# Patient Record
Sex: Female | Born: 1945 | Race: Black or African American | Hispanic: No | Marital: Single | Smoking: Former smoker
Health system: Southern US, Community
[De-identification: ages and names within clinical notes are randomized; demographics above are authoritative.]

## PROBLEM LIST (undated history)

## (undated) DIAGNOSIS — E039 Hypothyroidism, unspecified: Secondary | ICD-10-CM

## (undated) DIAGNOSIS — R519 Headache, unspecified: Secondary | ICD-10-CM

## (undated) DIAGNOSIS — F419 Anxiety disorder, unspecified: Secondary | ICD-10-CM

## (undated) DIAGNOSIS — M1990 Unspecified osteoarthritis, unspecified site: Secondary | ICD-10-CM

## (undated) DIAGNOSIS — G4730 Sleep apnea, unspecified: Secondary | ICD-10-CM

## (undated) DIAGNOSIS — R002 Palpitations: Secondary | ICD-10-CM

## (undated) DIAGNOSIS — E079 Disorder of thyroid, unspecified: Secondary | ICD-10-CM

## (undated) DIAGNOSIS — I10 Essential (primary) hypertension: Secondary | ICD-10-CM

## (undated) DIAGNOSIS — R7303 Prediabetes: Secondary | ICD-10-CM

## (undated) HISTORY — PX: CHOLECYSTECTOMY: SHX55

## (undated) HISTORY — PX: BREAST REDUCTION SURGERY: SHX8

## (undated) HISTORY — PX: ABDOMINAL HYSTERECTOMY: SHX81

## (undated) HISTORY — PX: APPENDECTOMY: SHX54

---

## 2000-02-06 ENCOUNTER — Encounter: Payer: Self-pay | Admitting: Internal Medicine

## 2000-02-06 ENCOUNTER — Ambulatory Visit (HOSPITAL_COMMUNITY): Admission: RE | Admit: 2000-02-06 | Discharge: 2000-02-06 | Payer: Self-pay | Admitting: Internal Medicine

## 2001-09-04 ENCOUNTER — Emergency Department (HOSPITAL_COMMUNITY): Admission: EM | Admit: 2001-09-04 | Discharge: 2001-09-04 | Payer: Self-pay | Admitting: Emergency Medicine

## 2001-09-04 ENCOUNTER — Encounter: Payer: Self-pay | Admitting: Emergency Medicine

## 2001-12-15 ENCOUNTER — Emergency Department (HOSPITAL_COMMUNITY): Admission: EM | Admit: 2001-12-15 | Discharge: 2001-12-15 | Payer: Self-pay | Admitting: Emergency Medicine

## 2001-12-15 ENCOUNTER — Encounter: Payer: Self-pay | Admitting: Emergency Medicine

## 2002-03-07 ENCOUNTER — Emergency Department (HOSPITAL_COMMUNITY): Admission: EM | Admit: 2002-03-07 | Discharge: 2002-03-07 | Payer: Self-pay | Admitting: Emergency Medicine

## 2002-03-07 ENCOUNTER — Encounter: Payer: Self-pay | Admitting: Emergency Medicine

## 2002-09-07 ENCOUNTER — Emergency Department (HOSPITAL_COMMUNITY): Admission: EM | Admit: 2002-09-07 | Discharge: 2002-09-07 | Payer: Self-pay | Admitting: Emergency Medicine

## 2003-05-28 DIAGNOSIS — J189 Pneumonia, unspecified organism: Secondary | ICD-10-CM

## 2003-05-28 HISTORY — DX: Pneumonia, unspecified organism: J18.9

## 2010-04-20 ENCOUNTER — Emergency Department (HOSPITAL_COMMUNITY): Admission: EM | Admit: 2010-04-20 | Discharge: 2010-04-20 | Payer: Self-pay | Admitting: Family Medicine

## 2014-04-10 ENCOUNTER — Emergency Department (HOSPITAL_COMMUNITY): Payer: No Typology Code available for payment source

## 2014-04-10 ENCOUNTER — Encounter (HOSPITAL_COMMUNITY): Payer: Self-pay | Admitting: Emergency Medicine

## 2014-04-10 ENCOUNTER — Emergency Department (HOSPITAL_COMMUNITY)
Admission: EM | Admit: 2014-04-10 | Discharge: 2014-04-10 | Disposition: A | Discharge status: Home / Self Care | Payer: No Typology Code available for payment source | Attending: Emergency Medicine | Admitting: Emergency Medicine

## 2014-04-10 DIAGNOSIS — Z8639 Personal history of other endocrine, nutritional and metabolic disease: Secondary | ICD-10-CM | POA: Diagnosis not present

## 2014-04-10 DIAGNOSIS — S0993XA Unspecified injury of face, initial encounter: Secondary | ICD-10-CM | POA: Diagnosis not present

## 2014-04-10 DIAGNOSIS — S61311A Laceration without foreign body of left index finger with damage to nail, initial encounter: Secondary | ICD-10-CM | POA: Insufficient documentation

## 2014-04-10 DIAGNOSIS — S29001A Unspecified injury of muscle and tendon of front wall of thorax, initial encounter: Secondary | ICD-10-CM | POA: Insufficient documentation

## 2014-04-10 DIAGNOSIS — I10 Essential (primary) hypertension: Secondary | ICD-10-CM | POA: Diagnosis not present

## 2014-04-10 DIAGNOSIS — Y998 Other external cause status: Secondary | ICD-10-CM | POA: Insufficient documentation

## 2014-04-10 DIAGNOSIS — R413 Other amnesia: Secondary | ICD-10-CM | POA: Diagnosis not present

## 2014-04-10 DIAGNOSIS — S6992XA Unspecified injury of left wrist, hand and finger(s), initial encounter: Secondary | ICD-10-CM | POA: Diagnosis present

## 2014-04-10 DIAGNOSIS — Y9389 Activity, other specified: Secondary | ICD-10-CM | POA: Insufficient documentation

## 2014-04-10 DIAGNOSIS — Y92488 Other paved roadways as the place of occurrence of the external cause: Secondary | ICD-10-CM | POA: Diagnosis not present

## 2014-04-10 DIAGNOSIS — S6991XA Unspecified injury of right wrist, hand and finger(s), initial encounter: Secondary | ICD-10-CM | POA: Insufficient documentation

## 2014-04-10 HISTORY — DX: Essential (primary) hypertension: I10

## 2014-04-10 HISTORY — DX: Disorder of thyroid, unspecified: E07.9

## 2014-04-10 MED ORDER — LORAZEPAM 2 MG/ML IJ SOLN
INTRAMUSCULAR | Status: AC
  Filled 2014-04-10: qty 1

## 2014-04-10 MED ORDER — HYDROCODONE-ACETAMINOPHEN 5-325 MG PO TABS
2.0000 | ORAL_TABLET | Freq: Once | ORAL | Status: AC
  Administered 2014-04-10: 2 via ORAL
  Filled 2014-04-10: qty 2

## 2014-04-10 MED ORDER — LIDOCAINE HCL (PF) 1 % IJ SOLN: 30.0000 mL | Freq: Once | INTRAMUSCULAR | Status: DC

## 2014-04-10 MED ORDER — HYDROCODONE-ACETAMINOPHEN 5-325 MG PO TABS: 1.0000 | ORAL_TABLET | Freq: Four times a day (QID) | ORAL | Status: DC | PRN

## 2014-04-10 NOTE — ED Notes (Signed)
Earrings and necklace removed prior to go to CT and xray
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
## 2014-04-10 NOTE — ED Notes (Signed)
Patient transported to X-ray 
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
## 2014-04-22 DIAGNOSIS — E039 Hypothyroidism, unspecified: Secondary | ICD-10-CM | POA: Insufficient documentation

## 2015-04-03 DIAGNOSIS — E669 Obesity, unspecified: Secondary | ICD-10-CM | POA: Diagnosis not present

## 2015-04-03 DIAGNOSIS — R7309 Other abnormal glucose: Secondary | ICD-10-CM | POA: Diagnosis not present

## 2015-04-03 DIAGNOSIS — Z23 Encounter for immunization: Secondary | ICD-10-CM | POA: Diagnosis not present

## 2015-04-03 DIAGNOSIS — R413 Other amnesia: Secondary | ICD-10-CM | POA: Diagnosis not present

## 2015-04-03 DIAGNOSIS — M79642 Pain in left hand: Secondary | ICD-10-CM | POA: Diagnosis not present

## 2015-04-03 DIAGNOSIS — F329 Major depressive disorder, single episode, unspecified: Secondary | ICD-10-CM | POA: Diagnosis not present

## 2015-04-03 DIAGNOSIS — M25512 Pain in left shoulder: Secondary | ICD-10-CM | POA: Diagnosis not present

## 2015-04-03 DIAGNOSIS — E039 Hypothyroidism, unspecified: Secondary | ICD-10-CM | POA: Diagnosis not present

## 2015-04-03 DIAGNOSIS — M179 Osteoarthritis of knee, unspecified: Secondary | ICD-10-CM | POA: Diagnosis not present

## 2015-04-03 DIAGNOSIS — R5383 Other fatigue: Secondary | ICD-10-CM | POA: Diagnosis not present

## 2015-04-15 DIAGNOSIS — M7542 Impingement syndrome of left shoulder: Secondary | ICD-10-CM | POA: Diagnosis not present

## 2015-05-01 DIAGNOSIS — I10 Essential (primary) hypertension: Secondary | ICD-10-CM | POA: Diagnosis not present

## 2015-05-01 DIAGNOSIS — Z23 Encounter for immunization: Secondary | ICD-10-CM | POA: Diagnosis not present

## 2015-05-01 DIAGNOSIS — R7303 Prediabetes: Secondary | ICD-10-CM | POA: Diagnosis not present

## 2015-05-01 DIAGNOSIS — G479 Sleep disorder, unspecified: Secondary | ICD-10-CM | POA: Diagnosis not present

## 2015-05-04 ENCOUNTER — Encounter (HOSPITAL_COMMUNITY): Payer: Self-pay | Admitting: Emergency Medicine

## 2015-05-04 ENCOUNTER — Emergency Department (HOSPITAL_COMMUNITY)
Admission: EM | Admit: 2015-05-04 | Discharge: 2015-05-04 | Disposition: A | Discharge status: Home / Self Care | Payer: Medicare PPO | Source: Home / Self Care | Attending: Family Medicine | Admitting: Family Medicine

## 2015-05-04 DIAGNOSIS — H109 Unspecified conjunctivitis: Secondary | ICD-10-CM

## 2015-05-04 DIAGNOSIS — H5711 Ocular pain, right eye: Secondary | ICD-10-CM | POA: Diagnosis not present

## 2015-05-04 DIAGNOSIS — S058X1A Other injuries of right eye and orbit, initial encounter: Secondary | ICD-10-CM

## 2015-05-04 DIAGNOSIS — S0591XA Unspecified injury of right eye and orbit, initial encounter: Secondary | ICD-10-CM | POA: Diagnosis not present

## 2015-05-04 MED ORDER — TOBRAMYCIN 0.3 % OP SOLN: 1.0000 [drp] | OPHTHALMIC | Status: DC

## 2015-05-04 MED ORDER — TETRACAINE HCL 0.5 % OP SOLN
OPHTHALMIC | Status: AC
  Filled 2015-05-04: qty 2

## 2015-05-04 NOTE — ED Provider Notes (Signed)
CSN: [id number]     Arrival date & time 05/04/15  1432 History   First MD Initiated Contact with Patient 05/04/15 1507     Chief Complaint  Patient presents with  . Eye Problem   (Consider location/radiation/quality/duration/timing/severity/associated sxs/prior Treatment) HPI Comments: 69 year old female complaining of right eye pain upon awakening this morning. She had placed contact lenses in her eyes last p.m. and was reading. Upon awakening this morning she noticed pain, discomfort, redness and soreness to the right eye. She immediately removed her contact lens. She is having mild blurred vision to the right eye. No known trauma.   Past Medical History  Diagnosis Date  . Thyroid disease   . Hypertension    Past Surgical History  Procedure Laterality Date  . Cholecystectomy    . Abdominal hysterectomy    . Appendectomy     No family history on file. Social History  Substance Use Topics  . Smoking status: Never Smoker   . Smokeless tobacco: None  . Alcohol Use: No   OB History    No data available     Review of Systems  Constitutional: Positive for activity change. Negative for fever and fatigue.  HENT: Negative.   Eyes: Positive for pain, discharge and redness.       Scant clear watery discharge  Respiratory: Negative.   Neurological: Negative.   All other systems reviewed and are negative.   Allergies  Review of patient's allergies indicates no known allergies.  Home Medications   Prior to Admission medications   Medication Sig Start Date End Date Taking? Authorizing Provider  HYDROcodone-acetaminophen (NORCO) 5-325 MG per tablet Take 1-2 tablets by mouth every 6 (six) hours as needed for moderate pain or severe pain. 04/10/14   Orlie Dakin, MD  levothyroxine (SYNTHROID, LEVOTHROID) 100 MCG tablet Take 100 mcg by mouth daily. 01/26/14   Historical Provider, MD  propranolol (INDERAL) 40 MG tablet Take 40 mg by mouth 2 (two) times daily.    Historical Provider,  MD  tobramycin (TOBREX) 0.3 % ophthalmic solution Place 1 drop into the right eye every 4 (four) hours. X 5 days 05/04/15   Janne Napoleon, NP   Meds Ordered and Administered this Visit  Medications - No data to display  BP 140/71 mmHg  Pulse 69  Temp(Src) 97.8 F (36.6 C) (Oral)  Resp 16  SpO2 97% No data found.   Physical Exam  Constitutional: She is oriented to person, place, and time. She appears well-developed and well-nourished. No distress.  HENT:  Mouth/Throat: Oropharynx is clear and moist.  Eyes: EOM are normal. Pupils are equal, round, and reactive to light. No scleral icterus.  Mild to moderate erythema to the lower conjunctiva. Sclera injected. There is a thick mucoid, clear discharge. Anterior chamber is clear. Pupillary reaction is normal. No defects noticed over the cornea. There is a small conjunctival defect over the sclera superior to the cornea. No other defects seen. Full intraocular movement. No bleeding.  Neck: Normal range of motion. Neck supple.  Pulmonary/Chest: Effort normal.  Neurological: She is alert and oriented to person, place, and time. She exhibits normal muscle tone.  Skin: Skin is warm and dry.  Psychiatric: She has a normal mood and affect.  Nursing note and vitals reviewed.   ED Course  Procedures (including critical care time)  Labs Review Labs Reviewed - No data to display  Imaging Review No results found.   Visual Acuity Review  Right Eye Distance: 20/40 Left Eye Distance:  20/50 Bilateral Distance: 20/40  Right Eye Near:   Left Eye Near:    Bilateral Near:         MDM   1. Acute right eye pain   2. Conjunctivitis of right eye   3. Abrasion of eye, right, initial encounter   Tetracaine eye drops OD Examination and irrigation. Under magnification no FB's. Eye exam as above.   suspect this right eye condition may be due to a combination of factors including irritation and possible scleral abrasion due to the contact lens  and infection.  Tobrex eye drops Zaditor eye drops 1 drop in right eye twice daily Sustain eye lubricating drops as needed for dry eyes. Do not use the different eye drops within 1 hour of each other.     Janne Napoleon, NP 05/04/15 [phone]

## 2015-05-04 NOTE — Discharge Instructions (Signed)
How to Use Eye Drops and Eye Ointments Zaditor eye drops 1 drop in right eye twice daily Sustain eye lubricating drops as needed for dry eyes. Do not use the different eye drops within 1 hour of each other. HOW TO APPLY EYE DROPS Follow these steps when applying eye drops: 1. Wash your hands. 2. Tilt your head back. 3. Put a finger under your eye and use it to gently pull your lower lid downward. Keep that finger in place. 4. Using your other hand, hold the dropper between your thumb and index finger. 5. Position the dropper just over the edge of the lower lid. Hold it as close to your eye as you can without touching the dropper to your eye. 6. Steady your hand. One way to do this is to lean your index finger against your brow. 7. Look up. 8. Slowly and gently squeeze one drop of medicine into your eye. 9. Close your eye. 10. Place a finger between your lower eyelid and your nose. Press gently for 2 minutes. This increases the amount of time that the medicine is exposed to the eye. It also reduces side effects that can develop if the drop gets into the bloodstream through the nose. HOW TO APPLY EYE OINTMENTS Follow these steps when applying eye ointments: 1. Wash your hands. 2. Put a finger under your eye and use it to gently pull your lower lid downward. Keep that finger in place. 3. Using your other hand, place the tip of the tube between your thumb and index finger with the remaining fingers braced against your cheek or nose. 4. Hold the tube just over the edge of your lower lid without touching the tube to your lid or eyeball. 5. Look up. 6. Line the inner part of your lower lid with ointment. 7. Gently pull up on your upper lid and look down. This will force the ointment to spread over the surface of the eye. 8. Release the upper lid. 9. If you can, close your eyes for 1-2 minutes. Do not rub your eyes. If you applied the ointment correctly, your vision will be blurry for a few  minutes. This is normal. ADDITIONAL INFORMATION  Make sure to use the eye drops or ointment as told by your health care provider.  If you have been told to use both eye drops and an eye ointment, apply the eye drops first, then wait 3-4 minutes before you apply the ointment.  Try not to touch the tip of the dropper or tube to your eye. A dropper or tube that has touched the eye can become contaminated.   This information is not intended to replace advice given to you by your health care provider. Make sure you discuss any questions you have with your health care provider.   Document Released: 08/19/2000 Document Revised: 09/27/2014 Document Reviewed: 05/09/2014 Elsevier Interactive Patient Education [employer].

## 2015-05-04 NOTE — ED Notes (Signed)
Right eye redness, irritation.  Onset this morning.  Patient wears contacts.

## 2015-05-25 ENCOUNTER — Ambulatory Visit: Payer: Medicare HMO | Admitting: Nutrition

## 2015-06-06 ENCOUNTER — Ambulatory Visit: Payer: Medicare HMO | Admitting: Neurology

## 2015-06-08 ENCOUNTER — Encounter: Payer: Medicare Other | Attending: Family Medicine | Admitting: Nutrition

## 2015-06-08 ENCOUNTER — Ambulatory Visit: Payer: Medicare PPO | Admitting: Nutrition

## 2015-06-08 ENCOUNTER — Encounter: Payer: Self-pay | Admitting: Nutrition

## 2015-06-08 VITALS — Ht 67.75 in

## 2015-06-08 DIAGNOSIS — R7309 Other abnormal glucose: Secondary | ICD-10-CM | POA: Insufficient documentation

## 2015-06-08 DIAGNOSIS — Z713 Dietary counseling and surveillance: Secondary | ICD-10-CM | POA: Insufficient documentation

## 2015-06-08 DIAGNOSIS — E669 Obesity, unspecified: Secondary | ICD-10-CM | POA: Insufficient documentation

## 2015-06-08 DIAGNOSIS — R739 Hyperglycemia, unspecified: Secondary | ICD-10-CM

## 2015-06-08 NOTE — Patient Instructions (Addendum)
Goals 1. Follow the Plate MEthod 2. Eat three meals per day 3. Cut out sweets and non nutritive sweetners 4. Drink only water 5. Exercise 15-20 minutes per day. 6. Lose 1-2 lbs per week.
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
## 2015-07-05 ENCOUNTER — Ambulatory Visit: Payer: Medicare PPO | Admitting: Neurology

## 2015-07-13 ENCOUNTER — Ambulatory Visit (INDEPENDENT_AMBULATORY_CARE_PROVIDER_SITE_OTHER): Payer: Medicare Other | Admitting: Neurology

## 2015-07-13 ENCOUNTER — Ambulatory Visit: Payer: Medicare PPO | Admitting: Nutrition

## 2015-07-13 ENCOUNTER — Encounter: Payer: Self-pay | Admitting: Neurology

## 2015-07-13 ENCOUNTER — Other Ambulatory Visit (INDEPENDENT_AMBULATORY_CARE_PROVIDER_SITE_OTHER): Payer: Medicare Other

## 2015-07-13 VITALS — BP 128/84 | HR 66 | Resp 18 | Wt 259.0 lb

## 2015-07-13 DIAGNOSIS — Z87820 Personal history of traumatic brain injury: Secondary | ICD-10-CM

## 2015-07-13 DIAGNOSIS — R200 Anesthesia of skin: Secondary | ICD-10-CM

## 2015-07-13 DIAGNOSIS — R413 Other amnesia: Secondary | ICD-10-CM

## 2015-07-13 LAB — VITAMIN B12: Vitamin B-12: 485 pg/mL (ref 211–911)
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
## 2015-07-13 NOTE — Patient Instructions (Addendum)
1. Bloodwork for B12 2. Schedule MRI brain without contrast 3. Recommend starting to see a therapist to work through stressors/anxiety  4. Continue with physical exercise and brain stimulation exercises for brain health 5. Follow-up in 1 year  6. YOU HAVE BEEN SCHEDULED AT [facility] FOR AN MRI ON 07/24/15.    PLEASE ARRIVE @ 10AM   [address] [place], [place] [address]  [phone]

## 2015-07-14 ENCOUNTER — Telehealth: Payer: Self-pay | Admitting: Family Medicine

## 2015-07-14 NOTE — Telephone Encounter (Signed)
-----   Message from Cameron Sprang, MD sent at 07/14/2015  8:40 AM EST ----- Pls let her know b12 level is normal, thanks

## 2015-07-14 NOTE — Telephone Encounter (Signed)
Patient was notified of result. 

## 2015-10-27 ENCOUNTER — Encounter: Payer: Self-pay | Admitting: Neurology

## 2016-07-15 ENCOUNTER — Ambulatory Visit: Payer: Medicare Other | Admitting: Neurology

## 2016-07-24 ENCOUNTER — Ambulatory Visit: Payer: Medicare Other | Admitting: Neurology

## 2016-07-24 DIAGNOSIS — Z029 Encounter for administrative examinations, unspecified: Secondary | ICD-10-CM

## 2016-07-26 ENCOUNTER — Encounter: Payer: Self-pay | Admitting: Neurology

## 2017-04-24 ENCOUNTER — Other Ambulatory Visit: Payer: Self-pay | Admitting: Family Medicine

## 2017-04-24 DIAGNOSIS — Z1231 Encounter for screening mammogram for malignant neoplasm of breast: Secondary | ICD-10-CM

## 2017-04-24 DIAGNOSIS — E2839 Other primary ovarian failure: Secondary | ICD-10-CM

## 2017-06-04 ENCOUNTER — Ambulatory Visit
Admission: RE | Admit: 2017-06-04 | Discharge: 2017-06-04 | Disposition: A | Discharge status: Home / Self Care | Payer: Medicare Other | Source: Ambulatory Visit | Attending: Family Medicine | Admitting: Family Medicine

## 2017-06-04 DIAGNOSIS — Z1231 Encounter for screening mammogram for malignant neoplasm of breast: Secondary | ICD-10-CM

## 2017-06-04 DIAGNOSIS — E2839 Other primary ovarian failure: Secondary | ICD-10-CM

## 2017-06-11 ENCOUNTER — Encounter: Payer: Medicare Other | Attending: Family Medicine | Admitting: Nutrition

## 2017-06-11 ENCOUNTER — Encounter: Payer: Self-pay | Admitting: Nutrition

## 2017-06-11 DIAGNOSIS — R739 Hyperglycemia, unspecified: Secondary | ICD-10-CM

## 2017-06-11 NOTE — Patient Instructions (Signed)
Goals 1. Try to follow My Plate 2. Choose 1 of food  at each meal 3. Chicken, brussels sprouts, and butternut squash is perfect for dinner. Keep drinking water Walk 30 minutes 3 times per week
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
## 2017-07-02 ENCOUNTER — Ambulatory Visit: Payer: Medicare Other | Admitting: Nutrition

## 2017-07-31 ENCOUNTER — Ambulatory Visit: Payer: Medicare Other | Admitting: Nutrition

## 2017-08-19 ENCOUNTER — Ambulatory Visit: Payer: Medicare Other | Admitting: Nutrition

## 2019-12-21 ENCOUNTER — Other Ambulatory Visit: Payer: Self-pay

## 2019-12-21 ENCOUNTER — Encounter (HOSPITAL_COMMUNITY): Payer: Self-pay | Admitting: Emergency Medicine

## 2019-12-21 ENCOUNTER — Emergency Department (HOSPITAL_COMMUNITY)
Admission: EM | Admit: 2019-12-21 | Discharge: 2019-12-21 | Disposition: A | Discharge status: Home / Self Care | Payer: Medicare Other | Attending: Emergency Medicine | Admitting: Emergency Medicine

## 2019-12-21 DIAGNOSIS — R42 Dizziness and giddiness: Secondary | ICD-10-CM | POA: Diagnosis present

## 2019-12-21 DIAGNOSIS — Z79899 Other long term (current) drug therapy: Secondary | ICD-10-CM | POA: Insufficient documentation

## 2019-12-21 DIAGNOSIS — I10 Essential (primary) hypertension: Secondary | ICD-10-CM | POA: Diagnosis not present

## 2019-12-21 DIAGNOSIS — Z87891 Personal history of nicotine dependence: Secondary | ICD-10-CM | POA: Insufficient documentation

## 2019-12-21 LAB — URINALYSIS, ROUTINE W REFLEX MICROSCOPIC
Bilirubin Urine: NEGATIVE
Glucose, UA: NEGATIVE mg/dL
Hgb urine dipstick: NEGATIVE
Ketones, ur: NEGATIVE mg/dL
Leukocytes,Ua: NEGATIVE
Nitrite: NEGATIVE
Protein, ur: NEGATIVE mg/dL
Specific Gravity, Urine: 1.003 — ABNORMAL LOW (ref 1.005–1.030)
pH: 8 (ref 5.0–8.0)

## 2019-12-21 LAB — BASIC METABOLIC PANEL
Anion gap: 8 (ref 5–15)
BUN: 6 mg/dL — ABNORMAL LOW (ref 8–23)
CO2: 27 mmol/L (ref 22–32)
Calcium: 9.2 mg/dL (ref 8.9–10.3)
Chloride: 104 mmol/L (ref 98–111)
Creatinine, Ser: 0.61 mg/dL (ref 0.44–1.00)
GFR calc Af Amer: >60 mL/min (ref >60)
GFR calc non Af Amer: >60 mL/min (ref >60)
Glucose, Bld: 116 mg/dL — ABNORMAL HIGH (ref 70–99)
Potassium: 4.3 mmol/L (ref 3.5–5.1)
Sodium: 139 mmol/L (ref 135–145)

## 2019-12-21 LAB — CBC WITH DIFFERENTIAL/PLATELET
Abs Immature Granulocytes: 0.02 10*3/uL (ref 0.00–0.07)
Basophils Absolute: 0.1 10*3/uL (ref 0.0–0.1)
Basophils Relative: 1 %
Eosinophils Absolute: 0.2 10*3/uL (ref 0.0–0.5)
Eosinophils Relative: 3 %
HCT: 41 % (ref 36.0–46.0)
Hemoglobin: 13.2 g/dL (ref 12.0–15.0)
Immature Granulocytes: 0 %
Lymphocytes Relative: 40 %
Lymphs Abs: 3.3 10*3/uL (ref 0.7–4.0)
MCH: 33.2 pg (ref 26.0–34.0)
MCHC: 32.2 g/dL (ref 30.0–36.0)
MCV: 103 fL — ABNORMAL HIGH (ref 80.0–100.0)
Monocytes Absolute: 0.4 10*3/uL (ref 0.1–1.0)
Monocytes Relative: 5 %
Neutro Abs: 4.2 10*3/uL (ref 1.7–7.7)
Neutrophils Relative %: 51 %
Platelets: 325 10*3/uL (ref 150–400)
RBC: 3.98 MIL/uL (ref 3.87–5.11)
RDW: 13.3 % (ref 11.5–15.5)
WBC: 8.1 10*3/uL (ref 4.0–10.5)
nRBC: 0 % (ref 0.0–0.2)

## 2019-12-21 NOTE — Discharge Instructions (Addendum)
Stop taking the HCTZ until you follow-up with Dr. Sheryn Bison on Friday.  Continue taking your remaining prescribed medications as directed.

## 2019-12-21 NOTE — ED Provider Notes (Signed)
Shared service with APP.  I have personally seen and examined the patient, providing direct face to face care.  Physical exam findings and plan include patient presents with intermittent dizziness and general weakness since starting hydrochlorothiazide.  Patient well-appearing on exam, neurologically doing well, no fever, normal heart rate.  Discussed holding hydrochlorothiazide and close follow-up with primary doctor for which she already has an appointment.  Screening blood work unremarkable normal hemoglobin, normal electrolytes, patient did have urinary frequency no signs of infection on urinalysis.  No diagnosis found.     Elnora Morrison, MD 12/28/19 [phone]

## 2019-12-21 NOTE — ED Triage Notes (Signed)
Pt states she was prescribed HCTZ. Was already on propanolol. Still taking both. C/o of dizziness and weakness.   Pt is not orthostatic
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
## 2020-05-04 ENCOUNTER — Ambulatory Visit: Payer: Medicare Other | Attending: Internal Medicine

## 2020-05-04 DIAGNOSIS — Z23 Encounter for immunization: Secondary | ICD-10-CM

## 2020-05-04 NOTE — Progress Notes (Signed)
   Covid-19 Vaccination Clinic  Name:  Ruth Marquez    MRN: [id number] DOB: December 31, 1945  05/04/2020  [name] was observed post Covid-19 immunization for 15 minutes without incident. She was provided with Vaccine Information Sheet and instruction to access the V-Safe system.   [name] was instructed to call 911 with any severe reactions post vaccine: Marland Kitchen Difficulty breathing  . Swelling of face and throat  . A fast heartbeat  . A bad rash all over body  . Dizziness and weakness   Immunizations Administered    Name Date Dose VIS Date Route   Pfizer COVID-19 Vaccine 05/04/2020  2:28 PM 0.3 mL 03/15/2020 Intramuscular   Manufacturer: [employer]   Lot: [id number]   NDC: [address]-8

## 2020-08-29 ENCOUNTER — Other Ambulatory Visit: Payer: Self-pay

## 2020-08-29 ENCOUNTER — Encounter (HOSPITAL_COMMUNITY): Payer: Self-pay | Admitting: Emergency Medicine

## 2020-08-29 ENCOUNTER — Emergency Department (HOSPITAL_COMMUNITY)
Admission: EM | Admit: 2020-08-29 | Discharge: 2020-08-29 | Disposition: A | Discharge status: Home / Self Care | Payer: Medicare Other | Attending: Emergency Medicine | Admitting: Emergency Medicine

## 2020-08-29 DIAGNOSIS — Z87891 Personal history of nicotine dependence: Secondary | ICD-10-CM | POA: Insufficient documentation

## 2020-08-29 DIAGNOSIS — M25562 Pain in left knee: Secondary | ICD-10-CM | POA: Insufficient documentation

## 2020-08-29 DIAGNOSIS — I10 Essential (primary) hypertension: Secondary | ICD-10-CM | POA: Insufficient documentation

## 2020-08-29 DIAGNOSIS — R4589 Other symptoms and signs involving emotional state: Secondary | ICD-10-CM | POA: Insufficient documentation

## 2020-08-29 DIAGNOSIS — J309 Allergic rhinitis, unspecified: Secondary | ICD-10-CM | POA: Insufficient documentation

## 2020-08-29 DIAGNOSIS — D126 Benign neoplasm of colon, unspecified: Secondary | ICD-10-CM | POA: Insufficient documentation

## 2020-08-29 DIAGNOSIS — F32A Depression, unspecified: Secondary | ICD-10-CM | POA: Insufficient documentation

## 2020-08-29 DIAGNOSIS — F418 Other specified anxiety disorders: Secondary | ICD-10-CM | POA: Insufficient documentation

## 2020-08-29 DIAGNOSIS — H9190 Unspecified hearing loss, unspecified ear: Secondary | ICD-10-CM | POA: Insufficient documentation

## 2020-08-29 DIAGNOSIS — G479 Sleep disorder, unspecified: Secondary | ICD-10-CM | POA: Insufficient documentation

## 2020-08-29 DIAGNOSIS — E039 Hypothyroidism, unspecified: Secondary | ICD-10-CM | POA: Insufficient documentation

## 2020-08-29 DIAGNOSIS — R7303 Prediabetes: Secondary | ICD-10-CM | POA: Insufficient documentation

## 2020-08-29 DIAGNOSIS — Z79899 Other long term (current) drug therapy: Secondary | ICD-10-CM | POA: Diagnosis not present

## 2020-08-29 DIAGNOSIS — E785 Hyperlipidemia, unspecified: Secondary | ICD-10-CM | POA: Insufficient documentation

## 2020-08-29 DIAGNOSIS — M79642 Pain in left hand: Secondary | ICD-10-CM | POA: Insufficient documentation

## 2020-08-29 DIAGNOSIS — R42 Dizziness and giddiness: Secondary | ICD-10-CM | POA: Diagnosis present

## 2020-08-29 DIAGNOSIS — M179 Osteoarthritis of knee, unspecified: Secondary | ICD-10-CM | POA: Insufficient documentation

## 2020-08-29 DIAGNOSIS — M1710 Unilateral primary osteoarthritis, unspecified knee: Secondary | ICD-10-CM | POA: Insufficient documentation

## 2020-08-29 DIAGNOSIS — E538 Deficiency of other specified B group vitamins: Secondary | ICD-10-CM | POA: Insufficient documentation

## 2020-08-29 DIAGNOSIS — F411 Generalized anxiety disorder: Secondary | ICD-10-CM | POA: Insufficient documentation

## 2020-08-29 DIAGNOSIS — E669 Obesity, unspecified: Secondary | ICD-10-CM | POA: Insufficient documentation

## 2020-08-29 LAB — TROPONIN I (HIGH SENSITIVITY): Troponin I (High Sensitivity): 3 ng/L (ref <18)

## 2020-08-29 LAB — COMPREHENSIVE METABOLIC PANEL
ALT: 12 U/L (ref 0–44)
AST: 17 U/L (ref 15–41)
Albumin: 3.5 g/dL (ref 3.5–5.0)
Alkaline Phosphatase: 80 U/L (ref 38–126)
Anion gap: 11 (ref 5–15)
BUN: 9 mg/dL (ref 8–23)
CO2: 24 mmol/L (ref 22–32)
Calcium: 9.3 mg/dL (ref 8.9–10.3)
Chloride: 104 mmol/L (ref 98–111)
Creatinine, Ser: 0.64 mg/dL (ref 0.44–1.00)
GFR, Estimated: >60 mL/min (ref >60)
Glucose, Bld: 172 mg/dL — ABNORMAL HIGH (ref 70–99)
Potassium: 3.8 mmol/L (ref 3.5–5.1)
Sodium: 139 mmol/L (ref 135–145)
Total Bilirubin: 0.4 mg/dL (ref 0.3–1.2)
Total Protein: 7.5 g/dL (ref 6.5–8.1)

## 2020-08-29 LAB — CBC WITH DIFFERENTIAL/PLATELET
Abs Immature Granulocytes: 0.03 10*3/uL (ref 0.00–0.07)
Basophils Absolute: 0.1 10*3/uL (ref 0.0–0.1)
Basophils Relative: 1 %
Eosinophils Absolute: 0.2 10*3/uL (ref 0.0–0.5)
Eosinophils Relative: 2 %
HCT: 40.7 % (ref 36.0–46.0)
Hemoglobin: 13.2 g/dL (ref 12.0–15.0)
Immature Granulocytes: 0 %
Lymphocytes Relative: 40 %
Lymphs Abs: 4.1 10*3/uL — ABNORMAL HIGH (ref 0.7–4.0)
MCH: 33.1 pg (ref 26.0–34.0)
MCHC: 32.4 g/dL (ref 30.0–36.0)
MCV: 102 fL — ABNORMAL HIGH (ref 80.0–100.0)
Monocytes Absolute: 0.7 10*3/uL (ref 0.1–1.0)
Monocytes Relative: 7 %
Neutro Abs: 5.3 10*3/uL (ref 1.7–7.7)
Neutrophils Relative %: 50 %
Platelets: 334 10*3/uL (ref 150–400)
RBC: 3.99 MIL/uL (ref 3.87–5.11)
RDW: 12.7 % (ref 11.5–15.5)
WBC: 10.4 10*3/uL (ref 4.0–10.5)
nRBC: 0 % (ref 0.0–0.2)

## 2020-08-29 LAB — TSH: TSH: 0.139 u[IU]/mL — ABNORMAL LOW (ref 0.350–4.500)

## 2020-08-29 MED ORDER — SODIUM CHLORIDE 0.9 % IV BOLUS
1000.0000 mL | Freq: Once | INTRAVENOUS | Status: AC
  Administered 2020-08-29: 1000 mL via INTRAVENOUS

## 2020-08-29 NOTE — ED Triage Notes (Signed)
Pt c/o dizziness and shaky feeling after waking up at 0400.

## 2020-08-29 NOTE — Discharge Instructions (Addendum)
Follow-up with your primary doctor in the next few days if symptoms are not improving.

## 2020-08-29 NOTE — ED Notes (Signed)
Patient seen in the ED tonight for dizziness, discharged home, VSS, ambulatory with steady gait. Patient verbalized understanding of discharge instructions and return parameters.

## 2020-08-29 NOTE — ED Provider Notes (Signed)
[facility] EMERGENCY DEPARTMENT Provider Note   CSN: [id number] Arrival date & time: 08/29/20  0440     History Chief Complaint  Patient presents with  . Dizziness    Ruth Marquez is a 75 y.o. female.  Patient is a 75 year old female with history of hypertension and hypothyroidism.  Patient presents today for evaluation of dizziness.  Patient tells me she woke from sleep at approximately 4 AM feeling jittery and shaky.  She felt dizzy when she stood to ambulate, then drove herself here.  She describes this as a lightheaded sensation, not as a spinning or disequilibrium.  Patient tells me she has had difficulty sleeping recently.  She is concerned that there is something wrong, possibly her blood sugar.  She denies any fevers or chills.  She denies any chest pain or difficulty breathing.  The history is provided by the patient.  Dizziness Quality:  Lightheadedness Severity:  Moderate Onset quality:  Sudden Duration:  1 hour Timing:  Constant Progression:  Unchanged Chronicity:  New Relieved by:  Nothing Worsened by:  Nothing Ineffective treatments:  None tried      Past Medical History:  Diagnosis Date  . Hypertension   . Thyroid disease     Patient Active Problem List   Diagnosis Date Noted  . Allergic rhinitis 08/29/2020  . Anxiety about health 08/29/2020  . Obesity 08/29/2020  . Depression 08/29/2020  . Generalized anxiety disorder 08/29/2020  . Hearing loss 08/29/2020  . Hyperlipidemia, unspecified 08/29/2020  . Hypertension 08/29/2020  . Osteoarthritis of knee 08/29/2020  . Pain in left knee 08/29/2020  . Pain of left hand 08/29/2020  . Prediabetes 08/29/2020  . Sleep disturbance 08/29/2020  . Tubular adenoma of colon 08/29/2020  . Vitamin B12 deficiency (non anemic) 08/29/2020  . Memory change 07/13/2015  . History of concussion 07/13/2015  . Left sided numbness 07/13/2015  . Acquired hypothyroidism 04/22/2014    Past Surgical History:  Procedure  Laterality Date  . ABDOMINAL HYSTERECTOMY    . APPENDECTOMY    . CHOLECYSTECTOMY       OB History   No obstetric history on file.     Family History  Problem Relation Age of Onset  . Heart disease Father     Social History   Tobacco Use  . Smoking status: Former Smoker    Types: Cigarettes  . Smokeless tobacco: Never Used  Substance Use Topics  . Alcohol use: No    Alcohol/week: 0.0 standard drinks    Home Medications Prior to Admission medications   Medication Sig Start Date End Date Taking? Authorizing Provider  levothyroxine (SYNTHROID, LEVOTHROID) 100 MCG tablet Take 100 mcg by mouth daily. 01/26/14   [provider]  propranolol (INDERAL) 40 MG tablet Take 40 mg by mouth 2 (two) times daily.    [provider]  tobramycin (TOBREX) 0.3 % ophthalmic solution Place 1 drop into the right eye every 4 (four) hours. X 5 days 05/04/15   Janne Napoleon, NP    Allergies    Gluten meal  Review of Systems   Review of Systems  Neurological: Positive for dizziness.  All other systems reviewed and are negative.   Physical Exam Updated Vital Signs BP (!) 154/90   Pulse 88   Temp 97.6 F (36.4 C)   Resp 17   Ht 5\' 7"  (1.702 m)   Wt 113.4 kg   SpO2 100%   BMI 39.16 kg/m   Physical Exam Vitals and nursing note reviewed.  Constitutional:      General: She is not in acute distress.    Appearance: She is well-developed. She is not diaphoretic.  HENT:     Head: Normocephalic and atraumatic.  Eyes:     Extraocular Movements: Extraocular movements intact.     Pupils: Pupils are equal, round, and reactive to light.  Cardiovascular:     Rate and Rhythm: Normal rate and regular rhythm.     Heart sounds: No murmur heard. No friction rub. No gallop.   Pulmonary:     Effort: Pulmonary effort is normal. No respiratory distress.     Breath sounds: Normal breath sounds. No wheezing.  Abdominal:     General: Bowel sounds are normal. There is no distension.      Palpations: Abdomen is soft.     Tenderness: There is no abdominal tenderness.  Musculoskeletal:        General: Normal range of motion.     Cervical back: Normal range of motion and neck supple.  Skin:    General: Skin is warm and dry.  Neurological:     General: No focal deficit present.     Mental Status: She is alert and oriented to person, place, and time.     Cranial Nerves: No cranial nerve deficit.     Sensory: No sensory deficit.     Motor: No weakness.     Coordination: Coordination normal.     ED Results / Procedures / Treatments   Labs (all labs ordered are listed, but only abnormal results are displayed) Labs Reviewed  COMPREHENSIVE METABOLIC PANEL  CBC WITH DIFFERENTIAL/PLATELET  TSH  TROPONIN I (HIGH SENSITIVITY)    EKG EKG Interpretation  Date/Time:  Tuesday August 29 2020 05:01:36 EDT Ventricular Rate:  89 PR Interval:  162 QRS Duration: 88 QT Interval:  370 QTC Calculation: 451 R Axis:   65 Text Interpretation: Sinus rhythm Low voltage, precordial leads Baseline wander in lead(s) I II aVR aVL Confirmed by Veryl Speak [phone]) on 08/29/2020 5:18:28 AM   Radiology No results found.  Procedures Procedures   Medications Ordered in ED Medications  sodium chloride 0.9 % bolus 1,000 mL (has no administration in time range)    ED Course  I have reviewed the triage vital signs and the nursing notes.  Pertinent labs & imaging results that were available during my care of the patient were reviewed by me and considered in my medical decision making (see chart for details).    MDM Rules/Calculators/A&P  Patient presenting here with complaints of feeling jittery and lightheaded.  This woke her from sleep at 4 AM.  Patient's vitals are stable and physical examination is nonfocal.  Her EKG is normal and laboratory studies are essentially unremarkable with the exception of a blood sugar of 180.  Patient advised to follow-up with primary doctor.  I see no  indication for admission.  She tells me she is feeling significantly improved after receiving IV fluids and reassurance.  Final Clinical Impression(s) / ED Diagnoses Final diagnoses:  None    Rx / DC Orders ED Discharge Orders    None       Veryl Speak, MD 08/29/20 [phone]

## 2021-01-24 ENCOUNTER — Other Ambulatory Visit: Payer: Self-pay | Admitting: Family Medicine

## 2021-01-24 ENCOUNTER — Other Ambulatory Visit: Payer: Self-pay | Admitting: *Deleted

## 2021-01-24 DIAGNOSIS — Z1231 Encounter for screening mammogram for malignant neoplasm of breast: Secondary | ICD-10-CM

## 2021-02-09 ENCOUNTER — Ambulatory Visit
Admission: RE | Admit: 2021-02-09 | Discharge: 2021-02-09 | Disposition: A | Discharge status: Home / Self Care | Payer: Medicare Other | Source: Ambulatory Visit | Attending: Family Medicine | Admitting: Family Medicine

## 2021-02-09 ENCOUNTER — Other Ambulatory Visit: Payer: Self-pay

## 2021-02-09 DIAGNOSIS — Z1231 Encounter for screening mammogram for malignant neoplasm of breast: Secondary | ICD-10-CM

## 2021-05-27 DIAGNOSIS — J1282 Pneumonia due to coronavirus disease 2019: Secondary | ICD-10-CM

## 2021-05-27 HISTORY — DX: Pneumonia due to coronavirus disease 2019: J12.82

## 2021-05-29 DIAGNOSIS — E538 Deficiency of other specified B group vitamins: Secondary | ICD-10-CM | POA: Diagnosis not present

## 2021-07-31 DIAGNOSIS — I10 Essential (primary) hypertension: Secondary | ICD-10-CM | POA: Diagnosis not present

## 2021-07-31 DIAGNOSIS — E538 Deficiency of other specified B group vitamins: Secondary | ICD-10-CM | POA: Diagnosis not present

## 2021-07-31 DIAGNOSIS — R7303 Prediabetes: Secondary | ICD-10-CM | POA: Diagnosis not present

## 2021-07-31 DIAGNOSIS — E785 Hyperlipidemia, unspecified: Secondary | ICD-10-CM | POA: Diagnosis not present

## 2021-07-31 DIAGNOSIS — L408 Other psoriasis: Secondary | ICD-10-CM | POA: Diagnosis not present

## 2021-07-31 DIAGNOSIS — E039 Hypothyroidism, unspecified: Secondary | ICD-10-CM | POA: Diagnosis not present

## 2021-08-21 DIAGNOSIS — Z0000 Encounter for general adult medical examination without abnormal findings: Secondary | ICD-10-CM | POA: Diagnosis not present

## 2021-08-21 DIAGNOSIS — E785 Hyperlipidemia, unspecified: Secondary | ICD-10-CM | POA: Diagnosis not present

## 2021-08-21 DIAGNOSIS — J309 Allergic rhinitis, unspecified: Secondary | ICD-10-CM | POA: Diagnosis not present

## 2021-08-21 DIAGNOSIS — I10 Essential (primary) hypertension: Secondary | ICD-10-CM | POA: Diagnosis not present

## 2021-08-21 DIAGNOSIS — F329 Major depressive disorder, single episode, unspecified: Secondary | ICD-10-CM | POA: Diagnosis not present

## 2021-08-21 DIAGNOSIS — E039 Hypothyroidism, unspecified: Secondary | ICD-10-CM | POA: Diagnosis not present

## 2021-10-26 DIAGNOSIS — M62830 Muscle spasm of back: Secondary | ICD-10-CM | POA: Diagnosis not present

## 2022-04-17 DIAGNOSIS — R197 Diarrhea, unspecified: Secondary | ICD-10-CM | POA: Diagnosis not present

## 2022-04-17 DIAGNOSIS — R0981 Nasal congestion: Secondary | ICD-10-CM | POA: Diagnosis not present

## 2022-04-17 DIAGNOSIS — Z03818 Encounter for observation for suspected exposure to other biological agents ruled out: Secondary | ICD-10-CM | POA: Diagnosis not present

## 2022-04-26 DIAGNOSIS — R509 Fever, unspecified: Secondary | ICD-10-CM | POA: Diagnosis not present

## 2022-04-26 DIAGNOSIS — I517 Cardiomegaly: Secondary | ICD-10-CM | POA: Diagnosis not present

## 2022-04-26 DIAGNOSIS — Z79899 Other long term (current) drug therapy: Secondary | ICD-10-CM | POA: Diagnosis not present

## 2022-04-26 DIAGNOSIS — R059 Cough, unspecified: Secondary | ICD-10-CM | POA: Diagnosis not present

## 2022-04-26 DIAGNOSIS — U071 COVID-19: Secondary | ICD-10-CM | POA: Diagnosis not present

## 2022-04-26 DIAGNOSIS — Z91018 Allergy to other foods: Secondary | ICD-10-CM | POA: Diagnosis not present

## 2022-05-16 DIAGNOSIS — R053 Chronic cough: Secondary | ICD-10-CM | POA: Diagnosis not present

## 2022-05-16 DIAGNOSIS — J069 Acute upper respiratory infection, unspecified: Secondary | ICD-10-CM | POA: Diagnosis not present

## 2022-05-27 DIAGNOSIS — C801 Malignant (primary) neoplasm, unspecified: Secondary | ICD-10-CM

## 2022-05-27 HISTORY — DX: Malignant (primary) neoplasm, unspecified: C80.1

## 2022-05-28 ENCOUNTER — Other Ambulatory Visit (HOSPITAL_COMMUNITY): Payer: Self-pay | Admitting: Family Medicine

## 2022-05-28 ENCOUNTER — Ambulatory Visit (HOSPITAL_COMMUNITY)
Admission: RE | Admit: 2022-05-28 | Discharge: 2022-05-28 | Disposition: A | Discharge status: Home / Self Care | Payer: Medicare PPO | Source: Ambulatory Visit | Attending: Family Medicine | Admitting: Family Medicine

## 2022-05-28 DIAGNOSIS — R059 Cough, unspecified: Secondary | ICD-10-CM | POA: Diagnosis not present

## 2022-05-28 DIAGNOSIS — R053 Chronic cough: Secondary | ICD-10-CM

## 2022-05-28 DIAGNOSIS — U071 COVID-19: Secondary | ICD-10-CM | POA: Diagnosis not present

## 2022-05-28 DIAGNOSIS — R062 Wheezing: Secondary | ICD-10-CM | POA: Diagnosis not present

## 2022-06-12 DIAGNOSIS — E039 Hypothyroidism, unspecified: Secondary | ICD-10-CM | POA: Diagnosis not present

## 2022-06-12 DIAGNOSIS — R5383 Other fatigue: Secondary | ICD-10-CM | POA: Diagnosis not present

## 2022-06-12 DIAGNOSIS — E668 Other obesity: Secondary | ICD-10-CM | POA: Diagnosis not present

## 2022-06-12 DIAGNOSIS — E7800 Pure hypercholesterolemia, unspecified: Secondary | ICD-10-CM | POA: Diagnosis not present

## 2022-06-12 DIAGNOSIS — R059 Cough, unspecified: Secondary | ICD-10-CM | POA: Diagnosis not present

## 2022-06-12 DIAGNOSIS — I10 Essential (primary) hypertension: Secondary | ICD-10-CM | POA: Diagnosis not present

## 2022-06-12 DIAGNOSIS — F341 Dysthymic disorder: Secondary | ICD-10-CM | POA: Diagnosis not present

## 2022-07-10 DIAGNOSIS — Z0000 Encounter for general adult medical examination without abnormal findings: Secondary | ICD-10-CM | POA: Diagnosis not present

## 2022-07-10 DIAGNOSIS — F341 Dysthymic disorder: Secondary | ICD-10-CM | POA: Diagnosis not present

## 2022-07-10 DIAGNOSIS — E039 Hypothyroidism, unspecified: Secondary | ICD-10-CM | POA: Diagnosis not present

## 2022-07-10 DIAGNOSIS — I10 Essential (primary) hypertension: Secondary | ICD-10-CM | POA: Diagnosis not present

## 2022-07-12 DIAGNOSIS — F341 Dysthymic disorder: Secondary | ICD-10-CM | POA: Diagnosis not present

## 2022-07-12 DIAGNOSIS — E039 Hypothyroidism, unspecified: Secondary | ICD-10-CM | POA: Diagnosis not present

## 2022-10-08 ENCOUNTER — Other Ambulatory Visit: Payer: Self-pay | Admitting: Family Medicine

## 2022-10-08 DIAGNOSIS — R5381 Other malaise: Secondary | ICD-10-CM

## 2022-10-08 DIAGNOSIS — Z1231 Encounter for screening mammogram for malignant neoplasm of breast: Secondary | ICD-10-CM

## 2022-11-27 ENCOUNTER — Other Ambulatory Visit: Payer: Self-pay | Admitting: Family Medicine

## 2022-11-27 DIAGNOSIS — M85851 Other specified disorders of bone density and structure, right thigh: Secondary | ICD-10-CM

## 2022-12-17 ENCOUNTER — Ambulatory Visit
Admission: RE | Admit: 2022-12-17 | Discharge: 2022-12-17 | Disposition: A | Discharge status: Home / Self Care | Payer: Medicare HMO | Source: Ambulatory Visit | Attending: Family Medicine | Admitting: Family Medicine

## 2022-12-17 DIAGNOSIS — Z1231 Encounter for screening mammogram for malignant neoplasm of breast: Secondary | ICD-10-CM

## 2022-12-20 ENCOUNTER — Other Ambulatory Visit: Payer: Self-pay | Admitting: Family Medicine

## 2022-12-20 DIAGNOSIS — R928 Other abnormal and inconclusive findings on diagnostic imaging of breast: Secondary | ICD-10-CM

## 2022-12-27 ENCOUNTER — Encounter: Payer: Self-pay | Admitting: Family Medicine

## 2022-12-27 ENCOUNTER — Ambulatory Visit: Admission: RE | Admit: 2022-12-27 | Payer: Medicare HMO | Source: Ambulatory Visit

## 2022-12-27 ENCOUNTER — Other Ambulatory Visit: Payer: Self-pay | Admitting: Family Medicine

## 2022-12-27 ENCOUNTER — Ambulatory Visit
Admission: RE | Admit: 2022-12-27 | Discharge: 2022-12-27 | Disposition: A | Discharge status: Home / Self Care | Payer: Medicare HMO | Source: Ambulatory Visit | Attending: Family Medicine | Admitting: Family Medicine

## 2022-12-27 DIAGNOSIS — R928 Other abnormal and inconclusive findings on diagnostic imaging of breast: Secondary | ICD-10-CM

## 2022-12-27 DIAGNOSIS — N6320 Unspecified lump in the left breast, unspecified quadrant: Secondary | ICD-10-CM

## 2022-12-27 DIAGNOSIS — R921 Mammographic calcification found on diagnostic imaging of breast: Secondary | ICD-10-CM

## 2023-01-07 ENCOUNTER — Ambulatory Visit
Admission: RE | Admit: 2023-01-07 | Discharge: 2023-01-07 | Disposition: A | Discharge status: Home / Self Care | Payer: Medicare HMO | Source: Ambulatory Visit | Attending: Family Medicine | Admitting: Family Medicine

## 2023-01-07 ENCOUNTER — Ambulatory Visit: Admission: RE | Admit: 2023-01-07 | Payer: Medicare HMO | Source: Ambulatory Visit

## 2023-01-07 ENCOUNTER — Other Ambulatory Visit: Payer: Medicare HMO

## 2023-01-07 DIAGNOSIS — R921 Mammographic calcification found on diagnostic imaging of breast: Secondary | ICD-10-CM

## 2023-01-07 DIAGNOSIS — N6320 Unspecified lump in the left breast, unspecified quadrant: Secondary | ICD-10-CM

## 2023-01-07 HISTORY — PX: BREAST BIOPSY: SHX20

## 2023-01-09 ENCOUNTER — Other Ambulatory Visit: Payer: Self-pay | Admitting: *Deleted

## 2023-01-10 ENCOUNTER — Telehealth: Payer: Self-pay | Admitting: *Deleted

## 2023-01-10 ENCOUNTER — Other Ambulatory Visit: Payer: Self-pay | Admitting: *Deleted

## 2023-01-10 ENCOUNTER — Encounter: Payer: Self-pay | Admitting: *Deleted

## 2023-01-10 DIAGNOSIS — C50412 Malignant neoplasm of upper-outer quadrant of left female breast: Secondary | ICD-10-CM | POA: Insufficient documentation

## 2023-01-10 NOTE — Telephone Encounter (Signed)
Left message for a return phone call to discuss navigation resources and answer any questions.  Contact information left on VM.

## 2023-01-13 ENCOUNTER — Inpatient Hospital Stay: Payer: Medicare HMO

## 2023-01-13 ENCOUNTER — Other Ambulatory Visit: Payer: Self-pay

## 2023-01-13 ENCOUNTER — Inpatient Hospital Stay: Payer: Medicare HMO | Attending: Hematology and Oncology | Admitting: Hematology and Oncology

## 2023-01-13 ENCOUNTER — Other Ambulatory Visit: Payer: Self-pay | Admitting: Surgery

## 2023-01-13 VITALS — BP 138/57 | HR 73 | Temp 97.3°F | Resp 18 | Ht 67.0 in | Wt 248.2 lb

## 2023-01-13 DIAGNOSIS — Z853 Personal history of malignant neoplasm of breast: Secondary | ICD-10-CM

## 2023-01-13 DIAGNOSIS — Z90710 Acquired absence of both cervix and uterus: Secondary | ICD-10-CM | POA: Diagnosis not present

## 2023-01-13 DIAGNOSIS — Z87891 Personal history of nicotine dependence: Secondary | ICD-10-CM | POA: Insufficient documentation

## 2023-01-13 DIAGNOSIS — C50412 Malignant neoplasm of upper-outer quadrant of left female breast: Secondary | ICD-10-CM

## 2023-01-13 DIAGNOSIS — Z170 Estrogen receptor positive status [ER+]: Secondary | ICD-10-CM | POA: Insufficient documentation

## 2023-01-13 LAB — CBC WITH DIFFERENTIAL (CANCER CENTER ONLY)
Abs Immature Granulocytes: 0.02 10*3/uL (ref 0.00–0.07)
Basophils Absolute: 0.1 10*3/uL (ref 0.0–0.1)
Basophils Relative: 1 %
Eosinophils Absolute: 0.3 10*3/uL (ref 0.0–0.5)
Eosinophils Relative: 3 %
HCT: 39.5 % (ref 36.0–46.0)
Hemoglobin: 12.9 g/dL (ref 12.0–15.0)
Immature Granulocytes: 0 %
Lymphocytes Relative: 38 %
Lymphs Abs: 3.4 10*3/uL (ref 0.7–4.0)
MCH: 31.9 pg (ref 26.0–34.0)
MCHC: 32.7 g/dL (ref 30.0–36.0)
MCV: 97.5 fL (ref 80.0–100.0)
Monocytes Absolute: 0.6 10*3/uL (ref 0.1–1.0)
Monocytes Relative: 6 %
Neutro Abs: 4.7 10*3/uL (ref 1.7–7.7)
Neutrophils Relative %: 52 %
Platelet Count: 297 10*3/uL (ref 150–400)
RBC: 4.05 MIL/uL (ref 3.87–5.11)
RDW: 13.4 % (ref 11.5–15.5)
WBC Count: 9 10*3/uL (ref 4.0–10.5)
nRBC: 0 % (ref 0.0–0.2)

## 2023-01-13 LAB — CMP (CANCER CENTER ONLY)
ALT: 18 U/L (ref 0–44)
AST: 20 U/L (ref 15–41)
Albumin: 3.9 g/dL (ref 3.5–5.0)
Alkaline Phosphatase: 85 U/L (ref 38–126)
Anion gap: 6 (ref 5–15)
BUN: 17 mg/dL (ref 8–23)
CO2: 27 mmol/L (ref 22–32)
Calcium: 9.6 mg/dL (ref 8.9–10.3)
Chloride: 106 mmol/L (ref 98–111)
Creatinine: 0.77 mg/dL (ref 0.44–1.00)
GFR, Estimated: >60 mL/min (ref >60)
Glucose, Bld: 129 mg/dL — ABNORMAL HIGH (ref 70–99)
Potassium: 4 mmol/L (ref 3.5–5.1)
Sodium: 139 mmol/L (ref 135–145)
Total Bilirubin: 0.5 mg/dL (ref 0.3–1.2)
Total Protein: 7.8 g/dL (ref 6.5–8.1)

## 2023-01-13 NOTE — Assessment & Plan Note (Signed)
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 After reviewing the pathology in detail, we proceeded to discuss the different treatment options between surgery, radiation, chemotherapy, antiestrogen therapies.  Recommendations: 1. Breast conserving surgery followed by 2. Oncotype DX testing based on the final surgery result 3. Adjuvant radiation therapy followed by 4. Adjuvant antiestrogen therapy  Oncotype counseling: I discussed Oncotype DX test. I explained to the patient that this is a 21 gene panel to evaluate patient tumors DNA to calculate recurrence score. This would help determine whether patient has high risk or low risk breast cancer. She understands that if her tumor was found to be high risk, she would benefit from systemic chemotherapy. If low risk, no need of chemotherapy.  Return to clinic after surgery to discuss final pathology report and then determine if Oncotype DX testing will need to be sent.

## 2023-01-13 NOTE — Progress Notes (Signed)
[facility] Clinical Social Work  Initial Assessment   Ruth Marquez is a 77 y.o. year old female presenting alone. Clinical Social Work was referred by medical provider for assessment of psychosocial needs.   SDOH (Social Determinants of Health) assessments performed: Yes   SDOH Screenings   Food Insecurity: Food Insecurity Present (09/27/2022)   Received from [facility], [facility]  Transportation Needs: No Transportation Needs (09/27/2022)   Received from [facility], [facility]  Utilities: At Risk (09/27/2022)   Received from [facility], [facility]  Financial Resource Strain: High Risk (09/27/2022)   Received from [facility], [facility]  Social Connections: Unknown (04/26/2022)   Received from [facility], [facility]  Tobacco Use: Medium Risk (12/26/2022)   Received from [facility]     Distress Screen completed: No     No data to display            Family/Social Information:   Housing Arrangement: patient lives alone at her home. Family members/support persons in your life? Family Transportation concerns: no  Employment: Retired.  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 no, patient does not fully understand treatment plan.  Concerns about diagnosis and/or treatment: Feelings of anger or sadness, Overwhelmed by information, and Afraid of cancer Patient reported stressors: Adjusting to my illness and Relating to God Hopes and/or priorities: to complete treatment.  Patient enjoys being outside Current coping skills/ strengths: Ability for insight , Average or above average intelligence , Capable of independent living , Communication  skills , General fund of knowledge , Motivation for treatment/growth , Religious Affiliation , and Special hobby/interest     SUMMARY: Current SDOH Barriers:  Limited social support  Clinical Social Work Clinical Goal(s):  Demonstrate a reduction in symptoms related to :adjusting to treatment.   Interventions: Discussed common feeling and emotions when being diagnosed with cancer, and the importance of support during treatment Informed patient of the support team roles and support services at [facility] Provided CSW contact information and encouraged patient to call with any questions or concerns Discussed the impact of treatment on identity and the meaning of treatment for patient.    Follow Up Plan: CSW will follow-up with patient by phone  Patient verbalizes understanding of plan: Yes  Marguerita Merles, LCSWA Clinical Social Worker [facility]

## 2023-01-14 ENCOUNTER — Other Ambulatory Visit: Payer: Self-pay | Admitting: Surgery

## 2023-01-14 ENCOUNTER — Telehealth: Payer: Self-pay

## 2023-01-14 ENCOUNTER — Encounter: Payer: Self-pay | Admitting: *Deleted

## 2023-01-14 DIAGNOSIS — Z853 Personal history of malignant neoplasm of breast: Secondary | ICD-10-CM
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
## 2023-01-14 NOTE — Telephone Encounter (Signed)
[facility] CSW Progress Note  Clinical Social Worker  attempted  to contact patient as requested to follow up after surgical consult. No answer, left vm with direct contact information.   Marguerita Merles, LCSWA Clinical Social Worker [facility]

## 2023-01-16 NOTE — Pre-Procedure Instructions (Addendum)
Surgical Instructions   Your procedure is scheduled on January 23, 2023. Report to [facility] Main Entrance "A" at 8:00 A.M., then check in with the Admitting office. Any questions or running late day of surgery: call [phone]  Questions prior to your surgery date: call [phone], Monday-Friday, 8am-4pm. If you experience any cold or flu symptoms such as cough, fever, chills, shortness of breath, etc. between now and your scheduled surgery, please notify us at the above number.     Remember:  Do not eat after midnight the night before your surgery   You may drink clear liquids until 7:00 AM the morning of your surgery.   Clear liquids allowed are: Water, Non-Citrus Juices (without pulp), Carbonated Beverages, Clear Tea, Black Coffee Only (NO MILK, CREAM OR POWDERED CREAMER of any kind), and Gatorade.  Patient Instructions  The night before surgery:  No food after midnight. ONLY clear liquids after midnight  The day of surgery (if you do NOT have diabetes):  Drink ONE (1) Pre-Surgery Clear Ensure by 7:00 AM the morning of surgery. Drink in one sitting. Do not sip.  This drink was given to you during your hospital  pre-op appointment visit.  Nothing else to drink after completing the  Pre-Surgery Clear Ensure.         If you have questions, please contact your surgeon's office.     Take these medicines the morning of surgery with A SIP OF WATER: amLODipine (NORVASC)  atorvastatin (LIPITOR)  isosorbide mononitrate (IMDUR)  levothyroxine (SYNTHROID, LEVOTHROID)  nebivolol (BYSTOLIC)  propranolol (INDERAL)    May take these medicines IF NEEDED: acetaminophen (TYLENOL)  ALPRAZolam (XANAX)  sodium chloride (OCEAN) nasal spray    Follow your surgeon's instructions on when to stop Aspirin.  If no instructions were given by your surgeon then you will need to call the office to get those instructions.     One week prior to surgery, STOP taking any Aleve, Naproxen,  Ibuprofen, Motrin, Advil, Goody's, BC's, all herbal medications, fish oil, and non-prescription vitamins.                     Do NOT Smoke (Tobacco/Vaping) for 24 hours prior to your procedure.  If you use a CPAP at night, you may bring your mask/headgear for your overnight stay.   You will be asked to remove any contacts, glasses, piercing's, hearing aid's, dentures/partials prior to surgery. Please bring cases for these items if needed.    Patients discharged the day of surgery will not be allowed to drive home, and someone needs to stay with them for 24 hours.  SURGICAL WAITING ROOM VISITATION Patients may have no more than 2 support people in the waiting area - these visitors may rotate.   Pre-op nurse will coordinate an appropriate time for 1 ADULT support person, who may not rotate, to accompany patient in pre-op.  Children under the age of 56 must have an adult with them who is not the patient and must remain in the main waiting area with an adult.  If the patient needs to stay at the hospital during part of their recovery, the visitor guidelines for inpatient rooms apply.  Please refer to the [facility] website for the visitor guidelines for any additional information.   If you received a COVID test during your pre-op visit  it is requested that you wear a mask when out in public, stay away from anyone that may not be feeling well and notify your  surgeon if you develop symptoms. If you have been in contact with anyone that has tested positive in the last 10 days please notify you surgeon.      Pre-operative CHG Bathing Instructions   You can play a key role in reducing the risk of infection after surgery. Your skin needs to be as free of germs as possible. You can reduce the number of germs on your skin by washing with CHG (chlorhexidine gluconate) soap before surgery. CHG is an antiseptic soap that kills germs and continues to kill germs even after washing.   DO NOT use if you  have an allergy to chlorhexidine/CHG or antibacterial soaps. If your skin becomes reddened or irritated, stop using the CHG and notify one of our RNs at [phone].              TAKE A SHOWER THE NIGHT BEFORE SURGERY AND THE DAY OF SURGERY    Please keep in mind the following:  DO NOT shave, including legs and underarms, 48 hours prior to surgery.   You may shave your face before/day of surgery.  Place clean sheets on your bed the night before surgery Use a clean washcloth (not used since being washed) for each shower. DO NOT sleep with pet's night before surgery.  CHG Shower Instructions:  If you choose to wash your hair and private area, wash first with your normal shampoo/soap.  After you use shampoo/soap, rinse your hair and body thoroughly to remove shampoo/soap residue.  Turn the water OFF and apply half the bottle of CHG soap to a CLEAN washcloth.  Apply CHG soap ONLY FROM YOUR NECK DOWN TO YOUR TOES (washing for 3-5 minutes)  DO NOT use CHG soap on face, private areas, open wounds, or sores.  Pay special attention to the area where your surgery is being performed.  If you are having back surgery, having someone wash your back for you may be helpful. Wait 2 minutes after CHG soap is applied, then you may rinse off the CHG soap.  Pat dry with a clean towel  Put on clean pajamas    Additional instructions for the day of surgery: DO NOT APPLY any lotions, deodorants, cologne, or perfumes.   Do not wear jewelry or makeup Do not wear nail polish, gel polish, artificial nails, or any other type of covering on natural nails (fingers and toes) Do not bring valuables to the hospital. [facility] is not responsible for valuables/personal belongings. Put on clean/comfortable clothes.  Please brush your teeth.  Ask your nurse before applying any prescription medications to the skin.

## 2023-01-17 ENCOUNTER — Encounter (HOSPITAL_COMMUNITY)
Admission: RE | Admit: 2023-01-17 | Discharge: 2023-01-17 | Disposition: A | Discharge status: Home / Self Care | Payer: Medicare HMO | Source: Ambulatory Visit | Attending: Surgery | Admitting: Surgery

## 2023-01-17 ENCOUNTER — Other Ambulatory Visit: Payer: Self-pay

## 2023-01-17 ENCOUNTER — Encounter (HOSPITAL_COMMUNITY): Payer: Self-pay

## 2023-01-17 DIAGNOSIS — Z79899 Other long term (current) drug therapy: Secondary | ICD-10-CM | POA: Diagnosis not present

## 2023-01-17 DIAGNOSIS — Z01818 Encounter for other preprocedural examination: Secondary | ICD-10-CM | POA: Diagnosis present

## 2023-01-17 DIAGNOSIS — I10 Essential (primary) hypertension: Secondary | ICD-10-CM | POA: Diagnosis not present

## 2023-01-17 DIAGNOSIS — G4733 Obstructive sleep apnea (adult) (pediatric): Secondary | ICD-10-CM | POA: Diagnosis not present

## 2023-01-17 DIAGNOSIS — C50912 Malignant neoplasm of unspecified site of left female breast: Secondary | ICD-10-CM | POA: Insufficient documentation

## 2023-01-17 DIAGNOSIS — R7303 Prediabetes: Secondary | ICD-10-CM | POA: Insufficient documentation

## 2023-01-17 DIAGNOSIS — Z87891 Personal history of nicotine dependence: Secondary | ICD-10-CM | POA: Diagnosis not present

## 2023-01-17 HISTORY — DX: Anxiety disorder, unspecified: F41.9

## 2023-01-17 HISTORY — DX: Headache, unspecified: R51.9

## 2023-01-17 HISTORY — DX: Hypothyroidism, unspecified: E03.9

## 2023-01-17 HISTORY — DX: Palpitations: R00.2

## 2023-01-17 HISTORY — DX: Unspecified osteoarthritis, unspecified site: M19.90

## 2023-01-17 HISTORY — DX: Prediabetes: R73.03

## 2023-01-17 HISTORY — DX: Sleep apnea, unspecified: G47.30

## 2023-01-17 NOTE — Progress Notes (Signed)
PCP - Dr. Henrine Screws Cardiologist - Dr. Wille Glaser - Last office visit 12/03/2022 ([facility])  PPM/ICD - Denies Device Orders - n/a Rep Notified - n/a  Chest x-ray - 09/23/2022 (CE) EKG - 09/23/2022 ([facility]) - Tracing requested Stress Test - 11/11/2022 ([facility]) ECHO - 09/27/2022 ([facility]) Cardiac Cath - Denies  Sleep Study - Recently tested positive for OSA, but has not received CPAP machine yet  Pt is Pre-DM  Last dose of GLP1 agonist- n/a GLP1 instructions: n/a  Blood Thinner Instructions: n/a Aspirin Instructions: Pt instructed to call surgeon for instructions, but she states she will just stop her ASA now. Her last dose will be August 22nd.  ERAS Protcol - Clear liquids until 0700 morning of surgery PRE-SURGERY Ensure or G2- Ensure given to pt with instructions  COVID TEST- n/a   Anesthesia review: Yes. Breast seed placement. Recent cardiac work-up and cardiology visit related to CP/High BP hospital visit April 2024.    Patient denies shortness of breath, fever, cough and chest pain at PAT appointment. Pt denies any respiratory illness/infection in the last two months. She does endorse a dry cough that she says is from Lisinopril and seasonal allergies. Pt instructed to call us if she develops any additional allergies such as productive cough, runny nose, fever, sore throat etc.   All instructions explained to the patient, with a verbal understanding of the material. Patient agrees to go over the instructions while at home for a better understanding. Patient also instructed to self quarantine after being tested for COVID-19. The opportunity to ask questions was provided.

## 2023-01-20 ENCOUNTER — Encounter (HOSPITAL_COMMUNITY): Payer: Self-pay

## 2023-01-20 NOTE — Progress Notes (Signed)
Anesthesia Chart Review:  Case: [id number] Date/Time: 01/23/23 0945   Procedure: RADIOACTIVE SEED GUIDED LEFT BREAST LUMPECTOMY (Left) - LMA   Anesthesia type: General   Pre-op diagnosis: LEFT BREAST CANCER   Location: [facility] ROOM 02 / [facility]   Surgeons: Abigail Miyamoto, MD       DISCUSSION: Patient is a Ruth Marquez scheduled for the above procedure.  History includes former smoker, HTN, pre-diabetes, palpitations (PSVT, 1.2% PSVC burden 09/2022 monitor), hypothyroidism, OSA (mild 11/24/22; awaiting CPAP), left breast cancer (01/07/23 left breast biopsy: invasive mammary carcinoma), hysterectomy. BMI is consistent with obesity.  Last cardiology visit with Dr. Houston Siren was on 12/03/22. She had ED visit on 09/23/22 for chest pain, HTN, palpitations. Reportedly as high as BP 209/109. She reported palpitations for over 50 years. Episodes can occur 2-3 times/week and last ~ 30 minutes or up to several hours. There can be associated tremor, diaphoresis.  She ruled out for ACS and out-patient cardiology follow-up arranged for 09/27/22. Propranolol changed to nebivolol (due to depression) and ASA every other day and Imdur 30 mg daily added. Stress test, echo, 7 day monitor, and sleep study ordered.  09/27/22 echo showed LVEF 65%, normal wall motion, mild concentric LVH, trace MR, mild TR, RVSP less than 36 mmHg. May 2024 monitor showed underlying sinus rhythm, minimum heart rate 45 bpm, maximum heart rate 182 bpm, average heart rate 85 bpm, 386 supraventricular episodes with the longest SVT run of 36 bpm with heart rate 124 bpm and fastest SVT run of 3 beats at 190 bpm, 0.6% PVC burden, 1.2% PSVC burden, 2 patient events during NSR with PAC couplet, longest R-R interval 1.63 seconds at 12:22 pm, average Qtc 455 msec, Min Qtc 423 msec, maximum Qtc 478 msec with Qtc > 460 msec  22.8 % of total time, no afib, VT or pauses > 3 seconds. 11/11/22 nuclear stress test was was considered overall low risk with EF 81%, no  significant ischemic or evidence of scar but GI interference and breast attenuation could mildly decrease specificity of the study. At 12/03/22 visit, she reported improvement in her palpations with only 2-3 episodes since May and only lasting a few minutes. Chest pain felt to be atypical as it had been reproducible and better with position change. She was able to walk 2 miles without difficulty, although reportedly walking 6 miles/day until she had COVID-19 in November 2023. BP 118/62 on amlodipine 5 mg p.o. twice a day, Imdur 60 mg p.o. twice a day, nebivolol 5 mg p.o. daily, lisinopril 2.5 mg p.o. daily. She was awaiting therapies for mild OSA. She was referred to [facility] for weight loss. Although palpitations overall improved on nabivolol, he is referring her to EP due to several runs of SVT on recent monitor. She is scheduled to see EP Erich Montane, MD with [facility] on 02/11/23.   Reported last ASA 01/16/23. RSL is scheduled ford 01/21/23 at 10:30 AM.   Anesthesia team to evaluate on the day of surgery.    VS: BP 127/61   Pulse 73   Temp 36.7 C   Resp 18   Ht 5\' 7"  (1.702 m)   Wt 110.5 kg   SpO2 99%   BMI 38.15 kg/m    PROVIDERS: Henrine Screws, MD is PCP  Dewaine Oats, MD is cardiologist Serena Croissant, MD is HEM-ONC Randolm Idol, MD is pulmonologist (for OSA). Per 12/06/22 office visit, recent sleep study showed mild OSA. Plan to start auto cpap 5-15 cm Water  with mask fitting. He will hold off sleep aids until she tries CPAP. Weight reduction encouraged.    LABS: Most recent labs results in [facility] include: Lab Results  Component Value Date   WBC 9.0 01/13/2023   HGB 12.9 01/13/2023   HCT 39.5 01/13/2023   PLT 297 01/13/2023   GLUCOSE 129 (H) 01/13/2023   ALT 18 01/13/2023   AST 20 01/13/2023   NA 139 01/13/2023   K 4.0 01/13/2023   CL 106 01/13/2023   CREATININE 0.77 01/13/2023   BUN 17 01/13/2023   CO2 27 01/13/2023   TSH 0.139 (L) 08/29/2020    Sleep  Study 11/24/22 ([facility]): IMPRESSION: Diagnostic PSG run in BR # 3 by Para March, RPSGT.   Once asleep, Patient exhibited mild Hypopnea and apnea events.  Overall AH 6.3/hr.  REM AHI 7.5/hr.  Stages 1, 2, Delta, REM x 1.  Normal Sinus Rhythm throughout.  Intermittent mild snoring to 2.  Supine and lateral sleep positions.   SPO2 nadir to 88%.  Patient did not meet split night criteria patient was not placed on nasal CPAP.      IMAGES: CXR 09/23/22 ([facility]): There is no acute infiltrate.  There is no pleural effusion.  The cardiac silhouette is unremarkable.  The visualized osseous structures demonstrate no gross abnormality.  No acute pulmonary process.   EKG:  EKG 09/23/22 ([facility]): Copy on shadow chart. Normal sinus rhythm with sinus arrhythmia  normal axis, normal pr interval  Low voltage QRS  Cannot rule out Anterior infarct , age undetermined  Abnormal ECG  No previous ECGs available  Joni Reining ([id number]) on 09/23/2022 4:12:04 AM certifies that he/she has reviewed the ECG tracing and confirms the independent  interpretation is  correct.    CV: Nuclear stress test 11/11/22 ([facility]): IMPRESSION:  1. Clinically the test was negative.      Electrically the test was negative.      Normal functional capacity with estimated workload 7 METs, which is above average functional capacity for age and gender.      Treadmill Duke Score + 4.25.      No arrhythmias.  2. Normal left ventricular systolic function, left ventricular ejection fraction 81 %. No regional wall motion abnormality.  3.  Small size mild intensity mid anterior and distal anterior reversible perfusion defect equivocal for tiny ischemia versus secondary to breast attenuation.        Small size mild intensity reversible basal inferolateral defect more likely secondary to GI interference and doubtful due to secondary to tiny ischemia.       No significant ischemic burden was noted.      No evidence of scar.      GI  interference and breast attenuation noted at rest and after exercise and could mildly decrease specificity of this study.      It is probably a low risk scan.    Long term Holter Monitor 09/27/22 - 10/04/22 ([facility]): - The patient was monitored for a total of 6d 23h, starting on Sep 27, 2022.  - Underlying rhythm is Sinus.  - The minimum heart rate was 45 bpm at 12:22 pm; the maximum 182 bpm; the average 85 bpm.  - Patient remained bradycardic 0.3 % of total time. Patient remained tachycardic 17.6 % of total time.  - 386 supraventricular episodes were found. Longest SVT was 36 beat run with HR 124 bpm at 4:03 am. Fastest SVT was 3 beat run with HR 190 bpm  at 12:45 pm.  - There were a total of only 360 PVCs with 0 couplets. Overall PVC Burden at 0.06 %.  - There were a total of 6904 PSVCs with 106 couplets. Overall PSVC Burden at 1.2 %.  - There is a total of 2 patient events. Patient felt palpitations during episode. The strips revealed sinus rhythm with HR 75-Ruth with 1 couplet PACs but no significant arrhythmias noted.  - The longest R-R interval is 1.63 seconds at 12:22 pm.  - The average Qtc is 455 msec. Min Qtc 423 msec, maximum Qtc 478 msec.  Qtc was > 460 msec  22.8 % of total time.  - No A. fib, no VT, no long pauses 3 seconds or more noted.    Echo 09/27/22 ([facility]): Left Ventricle: Systolic function is normal. EF: 65%.    Left Ventricle: Wall motion is normal.    Left Ventricle: Doppler parameters indicate normal diastolic function.    Left Ventricle: There is mild concentric hypertrophy.    Aortic Valve: The aortic valve is tricuspid. The left and the right  coronary cusps are not thickened and exhibit normal excursion.   Noncoronary cusp is slightly thickened with a normal excursion.    Right Ventricle: Abnormal systolic excursion velocity by TDI  (<9.5cm/s).   Right Ventricle: Systolic function is normal. Normal tricuspid annular  plane systolic excursion (TAPSE) >1.7 cm.     Right Ventricle: Right ventricle size is normal.    Tricuspid Valve: The right ventricular systolic pressure is normal (<36  mmHg).    Past Medical History:  Diagnosis Date   Anxiety    Arthritis    Bilateral Knees   Cancer (HCC) 2024   Left Breast Cancer   Headache    Hx   Hypertension    Hypothyroidism    Palpitations    PSVT runs, PSVC burden 1.2% 09/2022 Holter monitor   Pneumonia 2005   Pneumonia due to COVID-19 virus 2023   Pre-diabetes    Sleep apnea    Newly dx 2024. Has not received CPAP yet   Thyroid disease     Past Surgical History:  Procedure Laterality Date   ABDOMINAL HYSTERECTOMY     APPENDECTOMY     BREAST BIOPSY Left 01/07/2023   [place] LT BREAST BX W LOC DEV 1ST LESION IMG BX SPEC US GUIDE 01/07/2023 [facility]   BREAST BIOPSY Left 01/07/2023   MM LT BREAST BX W LOC DEV 1ST LESION IMAGE BX SPEC STEREO GUIDE 01/07/2023 [facility]   BREAST REDUCTION SURGERY Bilateral    CHOLECYSTECTOMY      MEDICATIONS:  acetaminophen (TYLENOL) 500 MG tablet   ALPRAZolam (XANAX) 0.5 MG tablet   amLODipine (NORVASC) 5 MG tablet   ASPIRIN LOW DOSE 81 MG tablet   atorvastatin (LIPITOR) 20 MG tablet   citalopram (CELEXA) 20 MG tablet   hyoscyamine (ANASPAZ) 0.125 MG TBDP disintergrating tablet   ibuprofen (ADVIL) 200 MG tablet   isosorbide mononitrate (IMDUR) 60 MG 24 hr tablet   ketoconazole (NIZORAL) 2 % cream   levothyroxine (SYNTHROID) 112 MCG tablet   lisinopril (ZESTRIL) 5 MG tablet   magnesium gluconate (MAGONATE) 500 MG tablet   montelukast (SINGULAIR) 10 MG tablet   nebivolol (BYSTOLIC) 5 MG tablet   OVER THE COUNTER MEDICATION   Probiotic Product (PROBIOTIC PO)   sodium chloride (OCEAN) 0.65 % SOLN nasal spray   sulconazole nitrate 1 % external solution   No current facility-administered medications for this encounter.  Shonna Chock, PA-C Surgical Short Stay/Anesthesiology [facility] Phone [phone] [facility] Phone [phone] 01/20/2023  11:20 AM

## 2023-01-20 NOTE — Anesthesia Preprocedure Evaluation (Signed)
Anesthesia Evaluation  Patient identified by MRN, date of birth, ID band Patient awake    Reviewed: Allergy & Precautions, H&P , NPO status , Patient's Chart, lab work & pertinent test results  Airway Mallampati: III  TM Distance: >3 FB Neck ROM: Full    Dental no notable dental hx. (+) Teeth Intact, Dental Advisory Given   Pulmonary sleep apnea , former smoker   Pulmonary exam normal breath sounds clear to auscultation       Cardiovascular hypertension, Pt. on medications  Rhythm:Regular Rate:Normal     Neuro/Psych  Headaches  Anxiety Depression       GI/Hepatic negative GI ROS, Neg liver ROS,,,  Endo/Other  Hypothyroidism  Morbid obesity  Renal/GU negative Renal ROS  negative genitourinary   Musculoskeletal  (+) Arthritis , Osteoarthritis,    Abdominal   Peds  Hematology negative hematology ROS (+)   Anesthesia Other Findings   Reproductive/Obstetrics negative OB ROS                             Anesthesia Physical Anesthesia Plan  ASA: 3  Anesthesia Plan: General   Post-op Pain Management: Tylenol PO (pre-op)*   Induction: Intravenous  PONV Risk Score and Plan: 4 or greater and Ondansetron, Dexamethasone and Treatment may vary due to age or medical condition  Airway Management Planned: LMA  Additional Equipment:   Intra-op Plan:   Post-operative Plan: Extubation in OR  Informed Consent: I have reviewed the patients History and Physical, chart, labs and discussed the procedure including the risks, benefits and alternatives for the proposed anesthesia with the patient or authorized representative who has indicated his/her understanding and acceptance.     Dental advisory given  Plan Discussed with: CRNA  Anesthesia Plan Comments: (PAT note written 01/20/2023 by Shonna Chock, PA-C.  )       Anesthesia Quick Evaluation

## 2023-01-21 ENCOUNTER — Ambulatory Visit
Admission: RE | Admit: 2023-01-21 | Discharge: 2023-01-21 | Disposition: A | Discharge status: Home / Self Care | Payer: Medicare HMO | Source: Ambulatory Visit | Attending: Surgery | Admitting: Surgery

## 2023-01-21 DIAGNOSIS — Z853 Personal history of malignant neoplasm of breast: Secondary | ICD-10-CM

## 2023-01-21 HISTORY — PX: BREAST BIOPSY: SHX20

## 2023-01-23 ENCOUNTER — Ambulatory Visit (HOSPITAL_BASED_OUTPATIENT_CLINIC_OR_DEPARTMENT_OTHER): Payer: Medicare HMO

## 2023-01-23 ENCOUNTER — Ambulatory Visit (HOSPITAL_COMMUNITY): Payer: Medicare HMO | Admitting: Vascular Surgery

## 2023-01-23 ENCOUNTER — Encounter (HOSPITAL_COMMUNITY): Payer: Self-pay | Admitting: Surgery

## 2023-01-23 ENCOUNTER — Encounter (HOSPITAL_COMMUNITY)
Admission: RE | Disposition: A | Discharge status: Home / Self Care | Payer: Self-pay | Source: Home / Self Care | Attending: Surgery

## 2023-01-23 ENCOUNTER — Ambulatory Visit
Admission: RE | Admit: 2023-01-23 | Discharge: 2023-01-23 | Disposition: A | Discharge status: Home / Self Care | Payer: Medicare HMO | Source: Ambulatory Visit | Attending: Surgery | Admitting: Surgery

## 2023-01-23 ENCOUNTER — Ambulatory Visit (HOSPITAL_COMMUNITY)
Admission: RE | Admit: 2023-01-23 | Discharge: 2023-01-23 | Disposition: A | Discharge status: Home / Self Care | Payer: Medicare HMO | Attending: Surgery | Admitting: Surgery

## 2023-01-23 DIAGNOSIS — Z1231 Encounter for screening mammogram for malignant neoplasm of breast: Secondary | ICD-10-CM | POA: Insufficient documentation

## 2023-01-23 DIAGNOSIS — Z170 Estrogen receptor positive status [ER+]: Secondary | ICD-10-CM

## 2023-01-23 DIAGNOSIS — G4730 Sleep apnea, unspecified: Secondary | ICD-10-CM | POA: Insufficient documentation

## 2023-01-23 DIAGNOSIS — E039 Hypothyroidism, unspecified: Secondary | ICD-10-CM | POA: Insufficient documentation

## 2023-01-23 DIAGNOSIS — Z87891 Personal history of nicotine dependence: Secondary | ICD-10-CM | POA: Insufficient documentation

## 2023-01-23 DIAGNOSIS — C50912 Malignant neoplasm of unspecified site of left female breast: Secondary | ICD-10-CM | POA: Diagnosis present

## 2023-01-23 DIAGNOSIS — C50412 Malignant neoplasm of upper-outer quadrant of left female breast: Secondary | ICD-10-CM

## 2023-01-23 DIAGNOSIS — Z6838 Body mass index (BMI) 38.0-38.9, adult: Secondary | ICD-10-CM | POA: Diagnosis not present

## 2023-01-23 DIAGNOSIS — E785 Hyperlipidemia, unspecified: Secondary | ICD-10-CM

## 2023-01-23 DIAGNOSIS — I10 Essential (primary) hypertension: Secondary | ICD-10-CM | POA: Insufficient documentation

## 2023-01-23 DIAGNOSIS — Z853 Personal history of malignant neoplasm of breast: Secondary | ICD-10-CM

## 2023-01-23 HISTORY — PX: BREAST LUMPECTOMY WITH RADIOACTIVE SEED LOCALIZATION: SHX6424

## 2023-01-23 SURGERY — BREAST LUMPECTOMY WITH RADIOACTIVE SEED LOCALIZATION
Anesthesia: General | Site: Breast | Laterality: Left

## 2023-01-23 MED ORDER — EPHEDRINE 5 MG/ML INJ
INTRAVENOUS | Status: AC
  Filled 2023-01-23: qty 5

## 2023-01-23 MED ORDER — GLYCOPYRROLATE 0.2 MG/ML IJ SOLN
INTRAMUSCULAR | Status: DC | PRN
  Administered 2023-01-23: .2 mg via INTRAVENOUS

## 2023-01-23 MED ORDER — FENTANYL CITRATE (PF) 100 MCG/2ML IJ SOLN: 25.0000 ug | INTRAMUSCULAR | Status: DC | PRN

## 2023-01-23 MED ORDER — DEXAMETHASONE SODIUM PHOSPHATE 10 MG/ML IJ SOLN
INTRAMUSCULAR | Status: DC | PRN
  Administered 2023-01-23: 10 mg via INTRAVENOUS

## 2023-01-23 MED ORDER — PHENYLEPHRINE 80 MCG/ML (10ML) SYRINGE FOR IV PUSH (FOR BLOOD PRESSURE SUPPORT)
PREFILLED_SYRINGE | INTRAVENOUS | Status: AC
  Filled 2023-01-23: qty 10

## 2023-01-23 MED ORDER — LIDOCAINE 2% (20 MG/ML) 5 ML SYRINGE
INTRAMUSCULAR | Status: AC
  Filled 2023-01-23: qty 5

## 2023-01-23 MED ORDER — ORAL CARE MOUTH RINSE: 15.0000 mL | Freq: Once | OROMUCOSAL | Status: AC

## 2023-01-23 MED ORDER — ACETAMINOPHEN 500 MG PO TABS
ORAL_TABLET | ORAL | Status: AC
  Administered 2023-01-23: 1000 mg via ORAL
  Filled 2023-01-23: qty 2

## 2023-01-23 MED ORDER — MIDAZOLAM HCL 2 MG/2ML IJ SOLN
INTRAMUSCULAR | Status: DC | PRN
  Administered 2023-01-23: 2 mg via INTRAVENOUS

## 2023-01-23 MED ORDER — CEFAZOLIN SODIUM-DEXTROSE 2-4 GM/100ML-% IV SOLN
INTRAVENOUS | Status: AC
  Filled 2023-01-23: qty 100

## 2023-01-23 MED ORDER — PHENYLEPHRINE 80 MCG/ML (10ML) SYRINGE FOR IV PUSH (FOR BLOOD PRESSURE SUPPORT)
PREFILLED_SYRINGE | INTRAVENOUS | Status: DC | PRN
  Administered 2023-01-23: 80 ug via INTRAVENOUS

## 2023-01-23 MED ORDER — ONDANSETRON HCL 4 MG/2ML IJ SOLN
INTRAMUSCULAR | Status: DC | PRN
Start: 2023-01-23 — End: 2023-01-23
  Administered 2023-01-23: 4 mg via INTRAVENOUS

## 2023-01-23 MED ORDER — ACETAMINOPHEN 500 MG PO TABS: 1000.0000 mg | ORAL_TABLET | ORAL | Status: AC

## 2023-01-23 MED ORDER — FENTANYL CITRATE (PF) 250 MCG/5ML IJ SOLN
INTRAMUSCULAR | Status: AC
  Filled 2023-01-23: qty 5

## 2023-01-23 MED ORDER — GLYCOPYRROLATE PF 0.2 MG/ML IJ SOSY
PREFILLED_SYRINGE | INTRAMUSCULAR | Status: AC
  Filled 2023-01-23: qty 1

## 2023-01-23 MED ORDER — EPHEDRINE SULFATE-NACL 50-0.9 MG/10ML-% IV SOSY
PREFILLED_SYRINGE | INTRAVENOUS | Status: DC | PRN
  Administered 2023-01-23: 5 mg via INTRAVENOUS
  Administered 2023-01-23 (×2): 10 mg via INTRAVENOUS

## 2023-01-23 MED ORDER — BUPIVACAINE-EPINEPHRINE (PF) 0.25% -1:200000 IJ SOLN
INTRAMUSCULAR | Status: AC
  Filled 2023-01-23: qty 30

## 2023-01-23 MED ORDER — CHLORHEXIDINE GLUCONATE 0.12 % MT SOLN: 15.0000 mL | Freq: Once | OROMUCOSAL | Status: AC

## 2023-01-23 MED ORDER — PROPOFOL 10 MG/ML IV BOLUS
INTRAVENOUS | Status: DC | PRN
  Administered 2023-01-23: 200 mg via INTRAVENOUS

## 2023-01-23 MED ORDER — PROPOFOL 10 MG/ML IV BOLUS
INTRAVENOUS | Status: AC
  Filled 2023-01-23: qty 20

## 2023-01-23 MED ORDER — LIDOCAINE 2% (20 MG/ML) 5 ML SYRINGE
INTRAMUSCULAR | Status: DC | PRN
  Administered 2023-01-23: 60 mg via INTRAVENOUS

## 2023-01-23 MED ORDER — SUCCINYLCHOLINE CHLORIDE 200 MG/10ML IV SOSY
PREFILLED_SYRINGE | INTRAVENOUS | Status: DC | PRN
  Administered 2023-01-23: 100 mg via INTRAVENOUS

## 2023-01-23 MED ORDER — MIDAZOLAM HCL 2 MG/2ML IJ SOLN
INTRAMUSCULAR | Status: AC
  Filled 2023-01-23: qty 2

## 2023-01-23 MED ORDER — CHLORHEXIDINE GLUCONATE 0.12 % MT SOLN
OROMUCOSAL | Status: AC
  Administered 2023-01-23: 15 mL via OROMUCOSAL
  Filled 2023-01-23: qty 15

## 2023-01-23 MED ORDER — FENTANYL CITRATE (PF) 250 MCG/5ML IJ SOLN
INTRAMUSCULAR | Status: DC | PRN
  Administered 2023-01-23: 150 ug via INTRAVENOUS
  Administered 2023-01-23: 100 ug via INTRAVENOUS

## 2023-01-23 MED ORDER — CHLORHEXIDINE GLUCONATE CLOTH 2 % EX PADS: 6.0000 | MEDICATED_PAD | Freq: Once | CUTANEOUS | Status: DC

## 2023-01-23 MED ORDER — 0.9 % SODIUM CHLORIDE (POUR BTL) OPTIME
TOPICAL | Status: DC | PRN
  Administered 2023-01-23: 1000 mL

## 2023-01-23 MED ORDER — CEFAZOLIN SODIUM-DEXTROSE 2-4 GM/100ML-% IV SOLN
2.0000 g | INTRAVENOUS | Status: AC
  Administered 2023-01-23: 2 g via INTRAVENOUS

## 2023-01-23 MED ORDER — ENSURE PRE-SURGERY PO LIQD: 296.0000 mL | Freq: Once | ORAL | Status: DC

## 2023-01-23 MED ORDER — DEXAMETHASONE SODIUM PHOSPHATE 10 MG/ML IJ SOLN
INTRAMUSCULAR | Status: AC
  Filled 2023-01-23: qty 1

## 2023-01-23 MED ORDER — BUPIVACAINE-EPINEPHRINE 0.25% -1:200000 IJ SOLN
INTRAMUSCULAR | Status: DC | PRN
  Administered 2023-01-23: 20 mL

## 2023-01-23 MED ORDER — OXYCODONE HCL 5 MG PO TABS: 5.0000 mg | ORAL_TABLET | Freq: Four times a day (QID) | ORAL | 0 refills | Status: DC | PRN

## 2023-01-23 MED ORDER — SUCCINYLCHOLINE CHLORIDE 200 MG/10ML IV SOSY
PREFILLED_SYRINGE | INTRAVENOUS | Status: AC
  Filled 2023-01-23: qty 10

## 2023-01-23 MED ORDER — LACTATED RINGERS IV SOLN: INTRAVENOUS | Status: DC

## 2023-01-23 SURGICAL SUPPLY — 36 items
ADH SKN CLS APL DERMABOND .7 (GAUZE/BANDAGES/DRESSINGS) ×1
APL PRP STRL LF DISP 70% ISPRP (MISCELLANEOUS) ×1
APPLIER CLIP 9.375 MED OPEN (MISCELLANEOUS) ×1 IMPLANT
APR CLP MED 9.3 20 MLT OPN (MISCELLANEOUS) ×1
BAG COUNTER SPONGE SURGICOUNT (BAG) ×1 IMPLANT
BAG SPNG CNTER NS LX DISP (BAG) ×1
BINDER BREAST 3XL (GAUZE/BANDAGES/DRESSINGS) IMPLANT
BINDER BREAST LRG (GAUZE/BANDAGES/DRESSINGS) IMPLANT
BINDER BREAST XLRG (GAUZE/BANDAGES/DRESSINGS) IMPLANT
CANISTER SUCT 3000ML PPV (MISCELLANEOUS) ×1 IMPLANT
CHLORAPREP W/TINT 26 (MISCELLANEOUS) ×1 IMPLANT
CLIP APPLIE 9.375 MED OPEN (MISCELLANEOUS) ×1 IMPLANT
COVER PROBE W GEL 5X96 (DRAPES) ×1 IMPLANT
COVER SURGICAL LIGHT HANDLE (MISCELLANEOUS) ×1 IMPLANT
DERMABOND ADVANCED .7 DNX12 (GAUZE/BANDAGES/DRESSINGS) ×1 IMPLANT
DEVICE DUBIN SPECIMEN MAMMOGRA (MISCELLANEOUS) ×1 IMPLANT
DRAPE CHEST BREAST 15X10 FENES (DRAPES) ×1 IMPLANT
ELECT CAUTERY BLADE 6.4 (BLADE) ×1 IMPLANT
ELECT REM PT RETURN 9FT ADLT (ELECTROSURGICAL) ×1 IMPLANT
ELECTRODE REM PT RTRN 9FT ADLT (ELECTROSURGICAL) ×1 IMPLANT
GLOVE SURG SIGNA 7.5 PF LTX (GLOVE) ×1 IMPLANT
GOWN STRL REUS W/ TWL LRG LVL3 (GOWN DISPOSABLE) ×1 IMPLANT
GOWN STRL REUS W/ TWL XL LVL3 (GOWN DISPOSABLE) ×1 IMPLANT
GOWN STRL REUS W/TWL LRG LVL3 (GOWN DISPOSABLE) ×1
GOWN STRL REUS W/TWL XL LVL3 (GOWN DISPOSABLE) ×1
KIT BASIN OR (CUSTOM PROCEDURE TRAY) ×1 IMPLANT
KIT MARKER MARGIN INK (KITS) ×1 IMPLANT
NDL HYPO 25GX1X1/2 BEV (NEEDLE) ×1 IMPLANT
NEEDLE HYPO 25GX1X1/2 BEV (NEEDLE) ×1 IMPLANT
NS IRRIG 1000ML POUR BTL (IV SOLUTION) IMPLANT
PACK GENERAL/GYN (CUSTOM PROCEDURE TRAY) ×1 IMPLANT
SUT MNCRL AB 4-0 PS2 18 (SUTURE) ×1 IMPLANT
SUT VIC AB 3-0 SH 18 (SUTURE) ×1 IMPLANT
SYR CONTROL 10ML LL (SYRINGE) ×1 IMPLANT
TOWEL GREEN STERILE (TOWEL DISPOSABLE) ×1 IMPLANT
TOWEL GREEN STERILE FF (TOWEL DISPOSABLE) ×1 IMPLANT

## 2023-01-23 NOTE — Transfer of Care (Signed)
Immediate Anesthesia Transfer of Care Note  Patient: Ruth Marquez  Procedure(s) Performed: RADIOACTIVE SEED GUIDED LEFT BREAST LUMPECTOMY (Left: Breast)  Patient Location: PACU  Anesthesia Type:General  Level of Consciousness: awake, alert , and oriented  Airway & Oxygen Therapy: Patient connected to face mask oxygen  Post-op Assessment: Report given to RN  Post vital signs: Reviewed and stable  Last Vitals:  Vitals Value Taken Time  BP 137/70 01/23/23 1015  Temp 36.5 C 01/23/23 1015  Pulse 75 01/23/23 1016  Resp 13 01/23/23 1016  SpO2 96 % 01/23/23 1016  Vitals shown include unfiled device data.  Last Pain:  Vitals:   01/23/23 1015  TempSrc:   PainSc: Asleep         Complications: No notable events documented.

## 2023-01-23 NOTE — Anesthesia Postprocedure Evaluation (Signed)
Anesthesia Post Note  Patient: Ruth Marquez  Procedure(s) Performed: RADIOACTIVE SEED GUIDED LEFT BREAST LUMPECTOMY (Left: Breast)     Patient location during evaluation: PACU Anesthesia Type: General Level of consciousness: awake and alert Pain management: pain level controlled Vital Signs Assessment: post-procedure vital signs reviewed and stable Respiratory status: spontaneous breathing, nonlabored ventilation and respiratory function stable Cardiovascular status: blood pressure returned to baseline and stable Postop Assessment: no apparent nausea or vomiting Anesthetic complications: no  No notable events documented.  Last Vitals:  Vitals:   01/23/23 1046 01/23/23 1100  BP:  113/66  Pulse: 79 72  Resp: 16 15  Temp:  36.6 C  SpO2: 91% 93%    Last Pain:  Vitals:   01/23/23 1045  TempSrc:   PainSc: Asleep                 Keshia Weare,W. EDMOND

## 2023-01-23 NOTE — Op Note (Signed)
RADIOACTIVE SEED GUIDED LEFT BREAST LUMPECTOMY  Procedure Note  Ruth Marquez 01/23/2023   Pre-op Diagnosis: LEFT BREAST CANCER     Post-op Diagnosis: same  Procedure(s): RADIOACTIVE SEED GUIDED LEFT BREAST LUMPECTOMY  Surgeon(s): Abigail Miyamoto, MD  Anesthesia: General  Staff:  Circulator: Pietro Cassis, RN Scrub Person: Horatio Pel, RN; Norman Herrlich  Estimated Blood Loss: Minimal               Specimens: sent to path  Indications: This is a 77 year old female who had undergone recent screening mammography showing calcifications in the left breast and a separate area showing a 9 mm mass.  On stereotactic biopsy the calcifications are benign but the 9 mm mass showed invasive ductal carcinoma.  The decision was made to proceed to the operating room for a radioactive seed guided left breast lumpectomy  Procedure: The patient was brought to the op room identified as correct patient.  She was placed upon the operating table and general anesthesia was induced.  Her left breast was prepped and draped in usual sterile fashion.  Using the neoprobe we located the radioactive seed which in the far lateral lower left breast.  I anesthetized skin in this area with Marcaine and then made an incision incorporating a small scar with a scalpel.  I then dissected down circumferentially into the breast tissue with electrocautery.  With a neoprobe I then dissected down to the area of the radioactive seed.  I stayed widely around the radioactive seed with the cautery and aid of the neoprobe.  I then was able to come posterior to the radioactive seed signal and complete the lumpectomy.  The lumpectomy specimen was then marked at the margin.  An x-ray was performed on the specimen confirming that the radioactive seed and previous biopsy clip in the center of the specimen.  The specimen was then sent to pathology for evaluation.  I achieved hemostasis with cautery.  I placed surgical clips  around the biopsy cavity.  I then anesthetized the incision further with Marcaine.  I then closed the subcutaneous tissue with interrupted 3-0 Vicryl sutures and closed the skin with a running 4-0 Monocryl.  Dermabond was then applied.  The patient was then placed in a breast binder.  She tolerated the procedure well.  All the counts were correct at the end of the procedure.  She was then extubated in the operating room and taken in a stable condition to the recovery room.          Abigail Miyamoto   Date: 01/23/2023  Time: 9:53 AM

## 2023-01-23 NOTE — H&P (Signed)
REFERRING PHYSICIAN: Kae Heller, NP PROVIDER: Wayne Both, MD MRN: [id number] DOB: [birth year] DATE OF ENCOUNTER: 01/13/2023 Subjective   Chief Complaint: New Consultation (Left breast cancer)  History of Present Illness: Ruth Marquez is a 77 y.o. female who is seen today as an office consultation for evaluation of New Consultation (Left breast cancer)  This is a 77 year old female referred here for evaluation of a newly diagnosed left breast cancer. She had undergone screening mammography which she was seen to have calcifications in her breast as well as a separate mass at the 3 o'clock position of the left breast. Biopsy of the calcification was benign but biopsy of the left breast mass showed an invasive ductal carcinoma. It measured 9 mm on ultrasound. It was 7 cm from the nipple. It was 90% ER positive, 70% PR positive, had a Ki-67 of 1%, and was HER2 negative. She has had no previous problems regarding her breast and there is no family history of breast cancer. She denies nipple discharge.  Review of Systems: A complete review of systems was obtained from the patient. I have reviewed this information and discussed as appropriate with the patient. See HPI as well for other ROS.  ROS   Medical History: Past Medical History:  Diagnosis Date  Hyperlipidemia  Hypertension  Sleep apnea  Thyroid disease   There is no problem list on file for this patient.  Past Surgical History:  Procedure Laterality Date  APPENDECTOMY  CHOLECYSTECTOMY  HYSTERECTOMY    Allergies  Allergen Reactions  Doxycycline Nausea And Vomiting  Gluten Other (See Comments)   Current Outpatient Medications on File Prior to Visit  Medication Sig Dispense Refill  levothyroxine (SYNTHROID) 112 MCG tablet Take by mouth  propranoloL (INDERAL) 40 MG tablet Take 40 mg by mouth every 12 (twelve) hours  tobramycin (TOBREX) 0.3 % ophthalmic solution 1 drop every 4 (four) hours   No current  facility-administered medications on file prior to visit.   Family History  Problem Relation Age of Onset  Diabetes Father  Coronary Artery Disease (Blocked arteries around heart) Father    Social History   Tobacco Use  Smoking Status Former  Types: Cigarettes  Smokeless Tobacco Never    Social History   Socioeconomic History  Marital status: Single  Tobacco Use  Smoking status: Former  Types: Cigarettes  Smokeless tobacco: Never  Vaping Use  Vaping status: Never Used  Substance and Sexual Activity  Alcohol use: Not Currently   Social Determinants of Health   Financial Resource Strain: High Risk (09/27/2022)  Received from [facility]  Overall Financial Resource Strain (CARDIA)  Difficulty of Paying Living Expenses: Very hard  Food Insecurity: No Food Insecurity (01/13/2023)  Received from [facility]  Hunger Vital Sign  Worried About Running Out of Food in the Last Year: Never true  Ran Out of Food in the Last Year: Never true  Transportation Needs: No Transportation Needs (09/27/2022)  Received from [facility] - Transportation  Lack of Transportation (Medical): No  Lack of Transportation (Non-Medical): No  [facility]  Social Network   Objective:   Vitals:  01/13/23 1454  BP: 124/68  Pulse: 98  Temp: 36.7 C (98 F)  SpO2: 97%  Weight: (!) 111.8 kg (246 lb 6.4 oz)  Height: 167.6 cm (5\' 6" )  PainSc: 0-No pain   Body mass index is 39.77 kg/m.  Physical Exam   She appears well on exam but is anxious  Are no palpable  breast masses in either breast. There is no axillary adenopathy. The nipple areolar complexes are normal.  Labs, Imaging and Diagnostic Testing: Reviewed her pathology results as well as her mammograms and ultrasounds and notes from the cancer center  Assessment and Plan:   Diagnoses and all orders for this visit:  Invasive ductal carcinoma of breast, left (CMS/HHS-HCC)   I discussed the diagnosis the  patient and her friend gave her a copy of the pathology results. She is already seen medical oncology. From a surgical standpoint we then discussed surgery for breast cancer. We discussed both breast conservation with lumpectomy versus mastectomy and the long-term results of each. She is a candidate for breast conservation. She would like to proceed as such. I next recommended proceeding with a left breast radioactive seed guided lumpectomy. I explained the surgical procedure in detail. We discussed the risks which includes but is not limited to bleeding, infection, injury to surrounding structures, the need for further surgery if margins are positive, already of pulmonary issues, DVT, and postoperative recovery. She understands and wished to proceed with surgery which will be scheduled

## 2023-01-23 NOTE — Discharge Instructions (Signed)
[facility] Office Phone Number [phone]  BREAST BIOPSY/ PARTIAL MASTECTOMY: POST OP INSTRUCTIONS  Always review your discharge instruction sheet given to you by the facility where your surgery was performed.  IF YOU HAVE DISABILITY OR FAMILY LEAVE FORMS, YOU MUST BRING THEM TO THE OFFICE FOR PROCESSING.  DO NOT GIVE THEM TO YOUR DOCTOR.  A prescription for pain medication may be given to you upon discharge.  Take your pain medication as prescribed, if needed.  If narcotic pain medicine is not needed, then you may take acetaminophen (Tylenol) or ibuprofen (Advil) as needed. Take your usually prescribed medications unless otherwise directed If you need a refill on your pain medication, please contact your pharmacy.  They will contact our office to request authorization.  Prescriptions will not be filled after 5pm or on week-ends. You should eat very light the first 24 hours after surgery, such as soup, crackers, pudding, etc.  Resume your normal diet the day after surgery. Most patients will experience some swelling and bruising in the breast.  Ice packs and a good support bra will help.  Swelling and bruising can take several days to resolve.  It is common to experience some constipation if taking pain medication after surgery.  Increasing fluid intake and taking a stool softener will usually help or prevent this problem from occurring.  A mild laxative (Milk of Magnesia or Miralax) should be taken according to package directions if there are no bowel movements after 48 hours. Unless discharge instructions indicate otherwise, you may remove your bandages 24-48 hours after surgery, and you may shower at that time.  You may have steri-strips (small skin tapes) in place directly over the incision.  These strips should be left on the skin for 7-10 days.  If your surgeon used skin glue on the incision, you may shower in 24 hours.  The glue will flake off over the next 2-3 weeks.  Any  sutures or staples will be removed at the office during your follow-up visit. ACTIVITIES:  You may resume regular daily activities (gradually increasing) beginning the next day.  Wearing a good support bra or sports bra minimizes pain and swelling.  You may have sexual intercourse when it is comfortable. You may drive when you no longer are taking prescription pain medication, you can comfortably wear a seatbelt, and you can safely maneuver your car and apply brakes. RETURN TO WORK:  ______________________________________________________________________________________ Ruth Marquez should see your doctor in the office for a follow-up appointment approximately two weeks after your surgery.  Your doctor's nurse will typically make your follow-up appointment when she calls you with your pathology report.  Expect your pathology report 2-3 business days after your surgery.  You may call to check if you do not hear from [place] after three days. OTHER INSTRUCTIONS: _YOU MAY REMOVE THE BINDER AND SHOWER STARTING TOMORROW AND THEN WEAR WHAT EVER MAKES YOU THE MOST COMFORTABLE ICE PACK, TYLENOL, AND IBUPROFEN ALSO FOR PAIN NO VIGOROUS ACTIVITY FOR ONE WEEK ______________________________________________________________________________________________ _____________________________________________________________________________________________________________________________________ _____________________________________________________________________________________________________________________________________ _____________________________________________________________________________________________________________________________________  WHEN TO CALL YOUR DOCTOR: Fever over 101.0 Nausea and/or vomiting. Extreme swelling or bruising. Continued bleeding from incision. Increased pain, redness, or drainage from the incision.  The clinic staff is available to answer your questions during regular business hours.   Please don't hesitate to call and ask to speak to one of the nurses for clinical concerns.  If you have a medical emergency, go to the nearest emergency room or call 911.  A Careers adviser from [facility] is always on call at the hospital.  For further questions, please visit [email or web address]

## 2023-01-23 NOTE — Interval H&P Note (Signed)
History and Physical Interval Note: no change in H and P  01/23/2023 8:09 AM  Ruth Marquez  has presented today for surgery, with the diagnosis of LEFT BREAST CANCER.  The various methods of treatment have been discussed with the patient and family. After consideration of risks, benefits and other options for treatment, the patient has consented to  Procedure(s) with comments: RADIOACTIVE SEED GUIDED LEFT BREAST LUMPECTOMY (Left) - LMA as a surgical intervention.  The patient's history has been reviewed, patient examined, no change in status, stable for surgery.  I have reviewed the patient's chart and labs.  Questions were answered to the patient's satisfaction.     Abigail Miyamoto

## 2023-01-23 NOTE — Anesthesia Procedure Notes (Signed)
Procedure Name: Intubation Date/Time: 01/23/2023 9:19 AM  Performed by: Georgianne Fick D, CRNAPre-anesthesia Checklist: Patient identified, Emergency Drugs available, Suction available and Patient being monitored Patient Re-evaluated:Patient Re-evaluated prior to induction Oxygen Delivery Method: Circle System Utilized Preoxygenation: Pre-oxygenation with 100% oxygen Induction Type: IV induction Ventilation: Mask ventilation without difficulty Laryngoscope Size: Mac and 3 Grade View: Grade I Tube type: Oral Tube size: 7.0 mm Number of attempts: 1 Airway Equipment and Method: Stylet and Oral airway Placement Confirmation: ETT inserted through vocal cords under direct vision, positive ETCO2 and breath sounds checked- equal and bilateral Secured at: 21 cm Tube secured with: Tape Dental Injury: Teeth and Oropharynx as per pre-operative assessment

## 2023-01-24 ENCOUNTER — Encounter (HOSPITAL_COMMUNITY): Payer: Self-pay | Admitting: Surgery

## 2023-01-30 LAB — SURGICAL PATHOLOGY

## 2023-01-31 ENCOUNTER — Encounter: Payer: Self-pay | Admitting: *Deleted

## 2023-01-31 ENCOUNTER — Telehealth: Payer: Self-pay | Admitting: *Deleted

## 2023-01-31 NOTE — Telephone Encounter (Signed)
Received order for oncotype testing. Requisition sent to pathology 

## 2023-02-11 ENCOUNTER — Inpatient Hospital Stay: Payer: Medicare HMO | Attending: Hematology and Oncology | Admitting: Hematology and Oncology

## 2023-02-11 VITALS — BP 136/65 | HR 63 | Temp 97.8°F | Resp 18 | Ht 67.0 in | Wt 240.4 lb

## 2023-02-11 DIAGNOSIS — C50412 Malignant neoplasm of upper-outer quadrant of left female breast: Secondary | ICD-10-CM | POA: Diagnosis present

## 2023-02-11 DIAGNOSIS — Z170 Estrogen receptor positive status [ER+]: Secondary | ICD-10-CM | POA: Insufficient documentation

## 2023-02-11 NOTE — Assessment & Plan Note (Signed)
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 I discussed the margins.  We also discussed the final staging along with previously performed ER/PR testing.  Treatment plan: Oncotype DX to determine if she would benefit from chemo Adjuvant radiation therapy Followed by adjuvant antiestrogen therapy.  Return to clinic based upon Oncotype DX test result

## 2023-02-11 NOTE — Progress Notes (Signed)
Patient Care Team: Henrine Screws, MD as PCP - General (Family Medicine) Donnelly Angelica, RN as Oncology Nurse Navigator Pershing Proud, RN as Oncology Nurse Navigator Abigail Miyamoto, MD as Consulting Physician (General Surgery) Serena Croissant, MD as Consulting Physician (Hematology and Oncology)  DIAGNOSIS:  Encounter Diagnosis  Name Primary?   Malignant neoplasm of upper-outer quadrant of left breast in female, estrogen receptor positive (HCC) Yes    SUMMARY OF ONCOLOGIC HISTORY: Oncology History  Malignant neoplasm of upper-outer quadrant of left breast in female, estrogen receptor positive (HCC)  01/07/2023 Initial Diagnosis   Screening mammogram detected left breast mass (0.9 cm) with distortion and calcifications spanning 1.8 cm posterior depth, axilla negative; biopsy of the mass: Grade 2 IDC ER 90%, PR 70%, HER2 2+ by IHC, FISH negative, Ki-67 1%   01/13/2023 Cancer Staging   Staging form: Breast, AJCC 8th Edition - Clinical: Stage IA (cT1b, cN0, cM0, G2, ER+, PR+, HER2-) - Signed by Serena Croissant, MD on 01/13/2023 Stage prefix: Initial diagnosis Histologic grading system: 3 grade system   01/23/2023 Surgery   Left lumpectomy: Grade 2 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  History of Present Illness   The patient, with a recent diagnosis of breast cancer, presents for a post-operative follow-up. She underwent surgery for a 1.4 cm breast cancer, which was successfully excised with negative margins. The tumor was positive for estrogen and progesterone receptors, and had a low proliferation rate (KI 67 of 1%). The final stage was determined to be Stage 1A.  The patient is currently in the recovery phase post-surgery, maintaining good hygiene  and support for the surgical site. She is also engaging in daily physical activity, such as walking, and maintaining hydration.  The patient also expressed concerns about the upcoming radiation therapy, including its duration and potential side effects.         ALLERGIES:  is allergic to doxycycline, gluten meal, and lisinopril.  MEDICATIONS:  Current Outpatient Medications  Medication Sig Dispense Refill   ALPRAZolam (XANAX) 0.5 MG tablet Take 0.5 mg by mouth 2 (two) times daily as needed for anxiety.     amLODipine (NORVASC) 5 MG tablet Take 5 mg by mouth every 12 (twelve) hours.     ASPIRIN LOW DOSE 81 MG tablet Take 81 mg by mouth every other day.     atorvastatin (LIPITOR) 20 MG tablet Take 20 mg by mouth daily.     citalopram (CELEXA) 20 MG tablet Take 20-40 mg by mouth at bedtime as needed (Sleep).     hyoscyamine (ANASPAZ) 0.125 MG TBDP disintergrating tablet Place 0.125 mg under the tongue every 6 (six) hours as needed for cramping.     isosorbide mononitrate (IMDUR) 60 MG 24 hr tablet Take 60 mg by mouth 2 (two) times daily.     ketoconazole (NIZORAL) 2 % cream Apply 1 Application topically daily as needed for irritation (Psoriasis).     levothyroxine (SYNTHROID) 112 MCG tablet Take 100 mcg by mouth daily.     magnesium gluconate (MAGONATE) 500 MG tablet Take 500 mg by mouth once a week.     montelukast (SINGULAIR) 10 MG tablet Take 10 mg by mouth at bedtime.     nebivolol (BYSTOLIC) 5 MG  tablet Take 5 mg by mouth daily.     OVER THE COUNTER MEDICATION Take 1 tablet by mouth once a week. Omega xl     Probiotic Product (PROBIOTIC PO) Take 2 capsules by mouth daily as needed (Constipation). Emma     sodium chloride (OCEAN) 0.65 % SOLN nasal spray Place 1-2 sprays into both nostrils as needed for congestion.     sulconazole nitrate 1 % external solution Apply 1 application  topically daily as needed (Psoriasis).     acetaminophen (TYLENOL) 500 MG tablet Take 1,000-1,500 mg by  mouth every 6 (six) hours as needed for moderate pain or mild pain. (Patient not taking: Reported on 02/11/2023)     ibuprofen (ADVIL) 200 MG tablet Take 200 mg by mouth every 6 (six) hours as needed for moderate pain or mild pain. (Patient not taking: Reported on 02/11/2023)     No current facility-administered medications for this visit.    PHYSICAL EXAMINATION: ECOG PERFORMANCE STATUS: 1 - Symptomatic but completely ambulatory  Vitals:   02/11/23 1047  BP: 136/65  Pulse: 63  Resp: 18  Temp: 97.8 F (36.6 C)  SpO2: 96%   Filed Weights   02/11/23 1047  Weight: 240 lb 6.4 oz (109 kg)      LABORATORY DATA:  I have reviewed the data as listed    Latest Ref Rng & Units 01/13/2023   10:49 AM 08/29/2020    5:16 AM 12/21/2019   12:25 PM  CMP  Glucose 70 - 99 mg/dL 981  191  478   BUN 8 - 23 mg/dL 17  9  6    Creatinine 0.44 - 1.00 mg/dL 2.95  6.21  3.08   Sodium 135 - 145 mmol/L 139  139  139   Potassium 3.5 - 5.1 mmol/L 4.0  3.8  4.3   Chloride 98 - 111 mmol/L 106  104  104   CO2 22 - 32 mmol/L 27  24  27    Calcium 8.9 - 10.3 mg/dL 9.6  9.3  9.2   Total Protein 6.5 - 8.1 g/dL 7.8  7.5    Total Bilirubin 0.3 - 1.2 mg/dL 0.5  0.4    Alkaline Phos 38 - 126 U/L 85  80    AST 15 - 41 U/L 20  17    ALT 0 - 44 U/L 18  12      Lab Results  Component Value Date   WBC 9.0 01/13/2023   HGB 12.9 01/13/2023   HCT 39.5 01/13/2023   MCV 97.5 01/13/2023   PLT 297 01/13/2023   NEUTROABS 4.7 01/13/2023    ASSESSMENT & PLAN:  Malignant neoplasm of upper-outer quadrant of left breast in female, estrogen receptor positive (HCC) 01/07/2023:Screening mammogram detected left breast mass (0.9 cm) with distortion and calcifications spanning 1.8 cm posterior depth, axilla negative; biopsy of the mass: Grade 2 IDC ER 90%, PR 70%, HER2 2+ by IHC, FISH negative, Ki-67 1%, biopsy of the calcifications: Benign  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 I discussed the margins.  We also discussed the final staging along with previously performed ER/PR testing.  Treatment plan: Oncotype DX to determine if she would benefit from chemo Adjuvant radiation therapy Followed by adjuvant antiestrogen therapy.  Return  to clinic based upon Oncotype DX test result ------------------------------------- Assessment and Plan    Breast Cancer (Stage 1A) Successful surgical removal of 1.4 cm tumor with negative margins. Tumor is ER/PR positive, HER2 negative, and has a low proliferation rate (KI 67 1%). Oncotype DX test sent to assess need for chemotherapy, results pending. - Await Oncotype DX results. If low risk, proceed with discussion of radiation therapy and antiestrogen pills.  Post-Surgical Recovery Patient is healing well post-surgery, maintaining cleanliness and dryness of surgical site. - Continue current post-surgical care.  General Health Maintenance Patient is walking daily and drinking plenty of water. - Continue these activities and reduce sugar intake.  Radiation Therapy Discussed the process and potential side effects of radiation therapy. - Schedule initial consultation with radiation oncologist for treatment planning once oncotype is available.    Follow-up Plan to see patient after completion of radiation therapy to discuss antiestrogen therapy and ongoing care. - Schedule follow-up appointment for after completion of radiation therapy.          No orders of the defined types were placed in this encounter.  The patient has a good understanding of the overall plan. she agrees with it. she will call with any problems that may develop before the next visit here. Total time spent: 30 mins including face to face time and time spent for planning, charting  and co-ordination of care   Tamsen Meek, MD 02/11/23

## 2023-02-21 ENCOUNTER — Encounter: Payer: Self-pay | Admitting: *Deleted

## 2023-02-25 ENCOUNTER — Encounter (HOSPITAL_COMMUNITY): Payer: Self-pay

## 2023-02-27 ENCOUNTER — Inpatient Hospital Stay: Payer: Medicare HMO | Attending: Hematology and Oncology | Admitting: Hematology and Oncology

## 2023-02-27 ENCOUNTER — Encounter: Payer: Self-pay | Admitting: *Deleted

## 2023-02-27 VITALS — BP 130/53 | HR 75 | Temp 97.3°F | Resp 18 | Ht 67.0 in | Wt 245.7 lb

## 2023-02-27 DIAGNOSIS — C50412 Malignant neoplasm of upper-outer quadrant of left female breast: Secondary | ICD-10-CM | POA: Diagnosis not present

## 2023-02-27 DIAGNOSIS — Z1732 Human epidermal growth factor receptor 2 negative status: Secondary | ICD-10-CM | POA: Diagnosis not present

## 2023-02-27 DIAGNOSIS — Z170 Estrogen receptor positive status [ER+]: Secondary | ICD-10-CM | POA: Diagnosis not present

## 2023-02-27 DIAGNOSIS — Z1721 Progesterone receptor positive status: Secondary | ICD-10-CM | POA: Insufficient documentation

## 2023-02-27 NOTE — Assessment & Plan Note (Signed)
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 Liver and kidney function will be monitored through out chemotherapy as abnormalities in liver and kidney function may be a side effect of treatment.  Neuropathy risk from Taxotere was discussed in detail. Risk of permanent bone marrow dysfunction and leukemia due to chemo were also discussed.  Plan: Port placement, chemo education, follow-up in 2 weeks to start chemo

## 2023-02-27 NOTE — Progress Notes (Signed)
Patient Care Team: Henrine Screws, MD as PCP - General (Family Medicine) Donnelly Angelica, RN as Oncology Nurse Navigator Pershing Proud, RN as Oncology Nurse Navigator Abigail Miyamoto, MD as Consulting Physician (General Surgery) Serena Croissant, MD as Consulting Physician (Hematology and Oncology)  DIAGNOSIS:  Encounter Diagnosis  Name Primary?   Malignant neoplasm of upper-outer quadrant of left breast in female, estrogen receptor positive (HCC) Yes    SUMMARY OF ONCOLOGIC HISTORY: Oncology History  Malignant neoplasm of upper-outer quadrant of left breast in female, estrogen receptor positive (HCC)  01/07/2023 Initial Diagnosis   Screening mammogram detected left breast mass (0.9 cm) with distortion and calcifications spanning 1.8 cm posterior depth, axilla negative; biopsy of the mass: Grade 2 IDC ER 90%, PR 70%, HER2 2+ by IHC, FISH negative, Ki-67 1%   01/13/2023 Cancer Staging   Staging form: Breast, AJCC 8th Edition - Clinical: Stage IA (cT1b, cN0, cM0, G2, ER+, PR+, HER2-) - Signed by Serena Croissant, MD on 01/13/2023 Stage prefix: Initial diagnosis Histologic grading system: 3 grade system   01/23/2023 Surgery   Left lumpectomy: Grade 2 IDC 1.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 The score of 27, slightly above the threshold of 25, has caused the patient to worry about the potential need for chemotherapy. The patient was initially hoping for a treatment plan involving radiation and antiestrogen pills. The patient lives  alone and is concerned about the potential fatigue and other side effects of chemotherapy. The patient is active, walking two to three miles a day, and maintains a healthy diet. The patient is also considering bariatric surgery to aid in weight loss. The patient has a history of hysterectomy and expresses confusion about the need for antiestrogen treatment.      ALLERGIES:  is allergic to doxycycline, gluten meal, and lisinopril.  MEDICATIONS:  Current Outpatient Medications  Medication Sig Dispense Refill   ALPRAZolam (XANAX) 0.5 MG tablet Take 0.5 mg by mouth 2 (two) times daily as needed for anxiety.     ASPIRIN LOW DOSE 81 MG tablet Take 81 mg by mouth every other day.     atorvastatin (LIPITOR) 20 MG tablet Take 20 mg by mouth daily.     citalopram (CELEXA) 20 MG tablet Take 20-40 mg by mouth at bedtime as needed (Sleep).     hyoscyamine (ANASPAZ) 0.125 MG TBDP disintergrating tablet Place 0.125 mg under the tongue every 6 (six) hours as needed for cramping.     isosorbide mononitrate (IMDUR) 60 MG 24 hr tablet Take 60 mg by mouth 2 (two) times daily.     ketoconazole (NIZORAL) 2 % cream Apply 1 Application topically daily as needed for irritation (Psoriasis).     levothyroxine (SYNTHROID) 112 MCG tablet Take 100 mcg by mouth daily.     magnesium gluconate (MAGONATE) 500 MG tablet Take 500 mg by mouth once a week.     montelukast (SINGULAIR) 10 MG tablet Take 10 mg by mouth at bedtime.     nebivolol (BYSTOLIC) 5 MG tablet  Take 5 mg by mouth daily.     OVER THE COUNTER MEDICATION Take 1 tablet by mouth once a week. Omega xl     Probiotic Product (PROBIOTIC PO) Take 2 capsules by mouth daily as needed (Constipation). Emma     sodium chloride (OCEAN) 0.65 % SOLN nasal spray Place 1-2 sprays into both nostrils as needed for congestion.     sulconazole nitrate 1 % external solution Apply 1 application  topically daily as needed (Psoriasis).     acetaminophen (TYLENOL) 500 MG tablet Take  1,000-1,500 mg by mouth every 6 (six) hours as needed for moderate pain or mild pain. (Patient not taking: Reported on 02/11/2023)     amLODipine (NORVASC) 5 MG tablet Take 5 mg by mouth every 12 (twelve) hours.     diltiazem (CARDIZEM SR) 120 MG 12 hr capsule Take by mouth.     ibuprofen (ADVIL) 200 MG tablet Take 200 mg by mouth every 6 (six) hours as needed for moderate pain or mild pain. (Patient not taking: Reported on 02/11/2023)     No current facility-administered medications for this visit.    PHYSICAL EXAMINATION: ECOG PERFORMANCE STATUS: 1 - Symptomatic but completely ambulatory  Vitals:   02/27/23 1046  BP: (!) 130/53  Pulse: 75  Resp: 18  Temp: (!) 97.3 F (36.3 C)  SpO2: 100%   Filed Weights   02/27/23 1046  Weight: 245 lb 11.2 oz (111.4 kg)      LABORATORY DATA:  I have reviewed the data as listed    Latest Ref Rng & Units 01/13/2023   10:49 AM 08/29/2020    5:16 AM 12/21/2019   12:25 PM  CMP  Glucose 70 - 99 mg/dL 161  096  045   BUN 8 - 23 mg/dL 17  9  6    Creatinine 0.44 - 1.00 mg/dL 4.09  8.11  9.14   Sodium 135 - 145 mmol/L 139  139  139   Potassium 3.5 - 5.1 mmol/L 4.0  3.8  4.3   Chloride 98 - 111 mmol/L 106  104  104   CO2 22 - 32 mmol/L 27  24  27    Calcium 8.9 - 10.3 mg/dL 9.6  9.3  9.2   Total Protein 6.5 - 8.1 g/dL 7.8  7.5    Total Bilirubin 0.3 - 1.2 mg/dL 0.5  0.4    Alkaline Phos 38 - 126 U/L 85  80    AST 15 - 41 U/L 20  17    ALT 0 - 44 U/L 18  12      Lab Results  Component Value Date   WBC 9.0 01/13/2023   HGB 12.9 01/13/2023   HCT 39.5 01/13/2023   MCV 97.5 01/13/2023   PLT 297 01/13/2023   NEUTROABS 4.7 01/13/2023    ASSESSMENT & PLAN:  Malignant neoplasm of upper-outer quadrant of left breast in female, estrogen receptor positive (HCC) 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 Discussed the implications of the score, the risk of recurrence (16% over 10 years), and the potential benefit of chemotherapy (reducing recurrence risk to 8%). However, considering patient's age, lifestyle, and preference, chemotherapy is not recommended. - Proceed with radiation therapy and antiestrogen pill. - Schedule an appointment with the nurse for a detailed class on lifestyle modifications and support.  Weight Management Patient expressed interest in bariatric surgery and is already implementing lifestyle changes including regular walking and dietary modifications. - Refer to weight loss clinic. - Encourage continuation of current lifestyle modifications.  Support Patient expressed need for more support. 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 - Schedule follow-up appointment in one month.      1 month follow-up for survivorship care plan visit.  Patient is extremely anxious to learn more about survivorship and therefore we decided to move it up.     No orders of the defined types were placed in this encounter.  The patient has a good understanding of the overall plan. she agrees with it. she will call with any problems that may develop before the next visit here. Total time spent: 30 mins including face to face time and time spent for planning, charting and co-ordination of care   Tamsen Meek, MD 02/27/23

## 2023-03-03 NOTE — Progress Notes (Signed)
Radiation Oncology         [phone] ________________________________  Name: Ruth Marquez        MRN: [id number]  Date of Service: 03/04/2023 DOB: 1945-10-28  CC:Henrine Screws, MD  Serena Croissant, MD     REFERRING PHYSICIAN: Serena Croissant, MD   DIAGNOSIS: The encounter diagnosis was Malignant neoplasm of upper-outer quadrant of left breast in female, estrogen receptor positive (HCC).   HISTORY OF PRESENT ILLNESS: Ruth Marquez is a 77 y.o. female seen in the multidisciplinary breast clinic for a new diagnosis of left breast cancer. The patient was found to have screening calcifications in the left breast, further diagnostic workup confirmed grade punctate calcifications spanning 1.8 cm in the outer left breast and in addition ultrasound showed a mass in the 3 o'clock position measuring 9 mm demonstrating peripheral Doppler flow and adjacent to the 3 o'clock position was benign clustering of cystic structures in the axilla was negative for adenopathy.  She underwent biopsies on 01/07/2023 a biopsy in the lateral left breast calcification showed benign vascularized fibroadipose tissue with focal stromal calcifications, and the mass into the 3 o'clock position was biopsied and showed a grade 2 invasive ductal carcinoma that was ER/PR positive, HER2 negative with a Ki-67 of 1%.  She underwent a lumpectomy on 01/23/2023 that showed a grade 2 invasive ductal carcinoma measuring 1.4 cm with associated low to intermediate grade DCIS her margins were negative for invasive and in situ disease and no lymph nodes were submitted.  Oncotype Dx score showed a value of 27.  The patient met with Dr. Pamelia Hoit and together they decided to forego chemotherapy given age, performance status, and ability to emotionally navigate treatment.  She is seen to consider adjuvant radiation.    PREVIOUS RADIATION THERAPY: No   PAST MEDICAL HISTORY:  Past Medical History:  Diagnosis Date   Anxiety    Arthritis     Bilateral Knees   Cancer (HCC) 2024   Left Breast Cancer   Headache    Hx   Hypertension    Hypothyroidism    Palpitations    PSVT runs, PSVC burden 1.2% 09/2022 Holter monitor   Pneumonia 2005   Pneumonia due to COVID-19 virus 2023   Pre-diabetes    Sleep apnea    Newly dx 2024. Has not received CPAP yet   Thyroid disease        PAST SURGICAL HISTORY: Past Surgical History:  Procedure Laterality Date   ABDOMINAL HYSTERECTOMY     APPENDECTOMY     BREAST BIOPSY Left 01/07/2023   [place] LT BREAST BX W LOC DEV 1ST LESION IMG BX SPEC US GUIDE 01/07/2023 [facility]   BREAST BIOPSY Left 01/07/2023   MM LT BREAST BX W LOC DEV 1ST LESION IMAGE BX SPEC STEREO GUIDE 01/07/2023 [facility]   BREAST BIOPSY  01/21/2023   MM LT RADIOACTIVE SEED LOC MAMMO GUIDE 01/21/2023 [facility]   BREAST LUMPECTOMY WITH RADIOACTIVE SEED LOCALIZATION Left 01/23/2023   Procedure: RADIOACTIVE SEED GUIDED LEFT BREAST LUMPECTOMY;  Surgeon: Abigail Miyamoto, MD;  Location: [facility] OR;  Service: General;  Laterality: Left;  LMA   BREAST REDUCTION SURGERY Bilateral    CHOLECYSTECTOMY       FAMILY HISTORY:  Family History  Problem Relation Age of Onset   Heart disease Father      SOCIAL HISTORY:  reports that she has quit smoking. Her smoking use included cigarettes. She has never used smokeless tobacco. She reports that she does  not drink alcohol and does not use drugs.  The patient is single and lives in [place].  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   ALLERGIES: Doxycycline, Gluten meal, and Lisinopril   MEDICATIONS:  Current Outpatient Medications  Medication Sig Dispense Refill   ALPRAZolam (XANAX) 0.5 MG tablet Take 0.5 mg by mouth 2 (two) times daily as needed for anxiety.     amLODipine (NORVASC) 5 MG tablet Take 5 mg by mouth every 12 (twelve) hours.      ASPIRIN LOW DOSE 81 MG tablet Take 81 mg by mouth every other day.     atorvastatin (LIPITOR) 20 MG tablet Take 20 mg by mouth daily.     citalopram (CELEXA) 20 MG tablet Take 20-40 mg by mouth at bedtime as needed (Sleep).     diltiazem (CARDIZEM SR) 120 MG 12 hr capsule Take by mouth.     hyoscyamine (ANASPAZ) 0.125 MG TBDP disintergrating tablet Place 0.125 mg under the tongue every 6 (six) hours as needed for cramping.     isosorbide mononitrate (IMDUR) 60 MG 24 hr tablet Take 60 mg by mouth 2 (two) times daily.     ketoconazole (NIZORAL) 2 % cream Apply 1 Application topically daily as needed for irritation (Psoriasis).     levothyroxine (SYNTHROID) 112 MCG tablet Take 100 mcg by mouth daily.     magnesium gluconate (MAGONATE) 500 MG tablet Take 500 mg by mouth once a week.     montelukast (SINGULAIR) 10 MG tablet Take 10 mg by mouth at bedtime.     nebivolol (BYSTOLIC) 5 MG tablet Take 5 mg by mouth daily.     OVER THE COUNTER MEDICATION Take 1 tablet by mouth once a week. Omega xl     Probiotic Product (PROBIOTIC PO) Take 2 capsules by mouth daily as needed (Constipation). Emma     sodium chloride (OCEAN) 0.65 % SOLN nasal spray Place 1-2 sprays into both nostrils as needed for congestion.     sulconazole nitrate 1 % external solution Apply 1 application  topically daily as needed (Psoriasis).     acetaminophen (TYLENOL) 500 MG tablet Take 1,000-1,500 mg by mouth every 6 (six) hours as needed for moderate pain or mild pain. (Patient not taking: Reported on 02/11/2023)     ibuprofen (ADVIL) 200 MG tablet Take 200 mg by mouth every 6 (six) hours as needed for moderate pain or mild pain. (Patient not taking: Reported on 02/11/2023)     No current facility-administered medications for this encounter.     REVIEW OF SYSTEMS: On review of systems, the patient reports that she is doing well overall. She has been happy to have more clarity on her diagnosis and the patient is waiting to hear about a  bariatric surgery referral. No other complaints are verbalized.     PHYSICAL EXAM:  Wt Readings from Last 3 Encounters:  03/04/23 244 lb 9.6 oz (110.9 kg)  02/27/23 245 lb 11.2 oz (111.4 kg)  02/11/23 240 lb 6.4 oz (109 kg)   Temp Readings from Last 3 Encounters:  03/04/23 97.8 F (36.6 C)  02/27/23 (!) 97.3 F (36.3 C) (Temporal)  02/11/23 97.8 F (36.6 C)   BP Readings from Last 3 Encounters:  03/04/23 (!) 126/55  02/27/23 (!) 130/53  02/11/23 136/65   Pulse Readings from Last 3 Encounters:  03/04/23 68  02/27/23 75  02/11/23 63    In general this is a well appearing African-American female in no acute distress. She's alert and oriented x4 and appropriate throughout the examination. Cardiopulmonary assessment is negative for acute distress and she exhibits normal effort.  Her left breast reveals a well-healed surgical incision site without erythema separation or drainage.    ECOG = 1  0 - Asymptomatic (Fully active, able to carry on all predisease activities without restriction)  1 - Symptomatic but completely ambulatory (Restricted in physically strenuous activity but ambulatory and able to carry out work of a light or sedentary nature. For example, light housework, office work)  2 - Symptomatic, <50% in bed during the day (Ambulatory and capable of all self care but unable to carry out any work activities. Up and about more than 50% of waking hours)  3 - Symptomatic, >50% in bed, but not bedbound (Capable of only limited self-care, confined to bed or chair 50% or more of waking hours)  4 - Bedbound (Completely disabled. Cannot carry on any self-care. Totally confined to bed or chair)  5 - Death   Santiago Glad MM, Creech RH, Tormey DC, et al. [phone]). "Toxicity and response criteria of the [facility]". Am. Evlyn Clines. Oncol. 5 (6): 649-55    LABORATORY DATA:  Lab Results  Component Value Date   WBC 9.0 01/13/2023   HGB 12.9 01/13/2023   HCT 39.5  01/13/2023   MCV 97.5 01/13/2023   PLT 297 01/13/2023   Lab Results  Component Value Date   NA 139 01/13/2023   K 4.0 01/13/2023   CL 106 01/13/2023   CO2 27 01/13/2023   Lab Results  Component Value Date   ALT 18 01/13/2023   AST 20 01/13/2023   ALKPHOS 85 01/13/2023   BILITOT 0.5 01/13/2023      RADIOGRAPHY: No results found.     IMPRESSION/PLAN: 1. Stage IA, pT1b, cN0M0, grade 2, ER/PR positive invasive ductal carcinoma of the left breast. Dr. Mitzi Hansen discusses the pathology findings and reviews the nature of early stage left breast disease. She has done well since surgery. While she has molecular testing suspicious for a higher risk histology, given her performance status, Dr. Pamelia Hoit has recommended avoiding chemotherapy. Dr. Mitzi Hansen discusses today the role of external radiotherapy to the breast  to reduce risks of local recurrence followed by antiestrogen therapy. With her higher risk tumor profile and without pathologic nodal status, Dr. Mitzi Hansen recommends a course of high tangent fields for her radiation. We discussed the risks, benefits, short, and long term effects of radiotherapy, as well as the curative intent, and the patient is interested in proceeding. Dr. Mitzi Hansen discusses the delivery and logistics of radiotherapy and anticipates a course of 4 weeks of high tangent radiotherapy with deep inspiration breath hold technique. Written consent is obtained and placed in the chart, a copy was provided to the patient. The patient will be contacted to coordinate treatment planning by our simulation department.  2. Obesity with a BMI of 38.3. The patient is aware of the role of weight loss in her cancer course and interested in bariatric surgery. She will be contacted by Dr. Earmon [facility] to coordinate the referral he's placed.    In a visit lasting 60 minutes, greater than 50% of the time was spent face to face reviewing her case, as well as in preparation of, discussing, and  coordinating the patient's care.  The above documentation reflects my direct  findings during this shared patient visit. Please see the separate note by Dr. Mitzi Hansen on this date for the remainder of the patient's plan of care.    Osker Mason, [facility]    **Disclaimer: This note was dictated with voice recognition software. Similar sounding words can inadvertently be transcribed and this note may contain transcription errors which may not have been corrected upon publication of note.**

## 2023-03-03 NOTE — Progress Notes (Signed)
New Breast Cancer Diagnosis: Left Breast Cancer UOQ  Did patient present with symptoms (if so, please note symptoms) or screening mammography?:Screening Calcifications     Location and Extent of disease :left breast. Located at 3 o'clock position, measured 1.4 cm in greatest dimension. Adenopathy no.   Histology per Pathology Report: grade 2, Invasive Ductal Carcinoma with low to intermediate grade DCIS 01/23/2023  Receptor Status: ER(positive), PR (positive), Her2-neu (negative), Ki-(1%)   Surgeon and surgical plan, if any:  Dr.Blackman -Radioactive seed guided Left Breast Lumpectomy 01/23/2023   Medical oncologist, treatment if any:   Dr. Pamelia Hoit 02/27/2023 -Recommendation: I did not recommend adjuvant chemotherapy because of her age, performance status, mental state as I have a strong suspicion that she would not be able to handle chemotherapy due to mental health concerns and lack of support Adjuvant radiation therapy Followed by adjuvant antiestrogen therapy -Oncotype 27   Family History of Breast/Ovarian/Prostate Cancer:   Lymphedema issues, if any:      Pain issues, if any:     SAFETY ISSUES: Prior radiation?  Pacemaker/ICD?  Possible current pregnancy? Hysterectomy Is the patient on methotrexate?   Current Complaints / other details:

## 2023-03-04 ENCOUNTER — Ambulatory Visit
Admission: RE | Admit: 2023-03-04 | Discharge: 2023-03-04 | Disposition: A | Discharge status: Home / Self Care | Payer: Medicare HMO | Source: Ambulatory Visit | Attending: Radiation Oncology | Admitting: Radiation Oncology

## 2023-03-04 ENCOUNTER — Encounter: Payer: Self-pay | Admitting: Radiation Oncology

## 2023-03-04 ENCOUNTER — Encounter: Payer: Self-pay | Admitting: *Deleted

## 2023-03-04 VITALS — BP 126/55 | HR 68 | Temp 97.8°F | Resp 20 | Ht 67.0 in | Wt 244.6 lb

## 2023-03-04 DIAGNOSIS — Z79890 Hormone replacement therapy (postmenopausal): Secondary | ICD-10-CM | POA: Diagnosis not present

## 2023-03-04 DIAGNOSIS — G4730 Sleep apnea, unspecified: Secondary | ICD-10-CM | POA: Insufficient documentation

## 2023-03-04 DIAGNOSIS — C50412 Malignant neoplasm of upper-outer quadrant of left female breast: Secondary | ICD-10-CM

## 2023-03-04 DIAGNOSIS — E669 Obesity, unspecified: Secondary | ICD-10-CM | POA: Diagnosis not present

## 2023-03-04 DIAGNOSIS — Z170 Estrogen receptor positive status [ER+]: Secondary | ICD-10-CM | POA: Diagnosis not present

## 2023-03-04 DIAGNOSIS — Z79899 Other long term (current) drug therapy: Secondary | ICD-10-CM | POA: Insufficient documentation

## 2023-03-04 DIAGNOSIS — E039 Hypothyroidism, unspecified: Secondary | ICD-10-CM | POA: Diagnosis not present

## 2023-03-04 DIAGNOSIS — I10 Essential (primary) hypertension: Secondary | ICD-10-CM | POA: Insufficient documentation

## 2023-03-04 DIAGNOSIS — Z6838 Body mass index (BMI) 38.0-38.9, adult: Secondary | ICD-10-CM | POA: Insufficient documentation

## 2023-03-04 DIAGNOSIS — M129 Arthropathy, unspecified: Secondary | ICD-10-CM | POA: Diagnosis not present

## 2023-03-04 DIAGNOSIS — Z87891 Personal history of nicotine dependence: Secondary | ICD-10-CM | POA: Diagnosis not present

## 2023-03-07 ENCOUNTER — Ambulatory Visit
Admission: RE | Admit: 2023-03-07 | Discharge: 2023-03-07 | Disposition: A | Discharge status: Home / Self Care | Payer: Medicare HMO | Source: Ambulatory Visit | Attending: Radiation Oncology | Admitting: Radiation Oncology

## 2023-03-07 DIAGNOSIS — Z510 Encounter for antineoplastic radiation therapy: Secondary | ICD-10-CM | POA: Insufficient documentation

## 2023-03-07 DIAGNOSIS — Z170 Estrogen receptor positive status [ER+]: Secondary | ICD-10-CM | POA: Insufficient documentation

## 2023-03-07 DIAGNOSIS — C50412 Malignant neoplasm of upper-outer quadrant of left female breast: Secondary | ICD-10-CM | POA: Insufficient documentation

## 2023-03-07 DIAGNOSIS — Z1721 Progesterone receptor positive status: Secondary | ICD-10-CM | POA: Insufficient documentation

## 2023-03-07 DIAGNOSIS — Z1732 Human epidermal growth factor receptor 2 negative status: Secondary | ICD-10-CM | POA: Insufficient documentation

## 2023-03-11 ENCOUNTER — Encounter: Payer: Self-pay | Admitting: *Deleted

## 2023-03-11 DIAGNOSIS — C50412 Malignant neoplasm of upper-outer quadrant of left female breast: Secondary | ICD-10-CM

## 2023-03-11 DIAGNOSIS — Z170 Estrogen receptor positive status [ER+]: Secondary | ICD-10-CM

## 2023-03-20 DIAGNOSIS — Z510 Encounter for antineoplastic radiation therapy: Secondary | ICD-10-CM | POA: Diagnosis not present

## 2023-03-24 ENCOUNTER — Other Ambulatory Visit: Payer: Self-pay

## 2023-03-24 ENCOUNTER — Ambulatory Visit
Admission: RE | Admit: 2023-03-24 | Discharge: 2023-03-24 | Disposition: A | Discharge status: Home / Self Care | Payer: Medicare HMO | Source: Ambulatory Visit | Attending: Radiation Oncology | Admitting: Radiation Oncology

## 2023-03-24 DIAGNOSIS — Z510 Encounter for antineoplastic radiation therapy: Secondary | ICD-10-CM | POA: Diagnosis not present

## 2023-03-24 LAB — RAD ONC ARIA SESSION SUMMARY
Course Elapsed Days: 0
Plan Fractions Treated to Date: 1
Plan Prescribed Dose Per Fraction: 2.66 Gy
Plan Total Fractions Prescribed: 16
Plan Total Prescribed Dose: 42.56 Gy
Reference Point Dosage Given to Date: 2.66 Gy
Reference Point Session Dosage Given: 2.66 Gy
Session Number: 1

## 2023-03-25 ENCOUNTER — Other Ambulatory Visit: Payer: Self-pay

## 2023-03-25 ENCOUNTER — Ambulatory Visit
Admission: RE | Admit: 2023-03-25 | Discharge: 2023-03-25 | Disposition: A | Discharge status: Home / Self Care | Payer: Medicare HMO | Source: Ambulatory Visit | Attending: Radiation Oncology | Admitting: Radiation Oncology

## 2023-03-25 DIAGNOSIS — Z510 Encounter for antineoplastic radiation therapy: Secondary | ICD-10-CM | POA: Diagnosis not present

## 2023-03-25 LAB — RAD ONC ARIA SESSION SUMMARY
Course Elapsed Days: 1
Plan Fractions Treated to Date: 2
Plan Prescribed Dose Per Fraction: 2.66 Gy
Plan Total Fractions Prescribed: 16
Plan Total Prescribed Dose: 42.56 Gy
Reference Point Dosage Given to Date: 5.32 Gy
Reference Point Session Dosage Given: 2.66 Gy
Session Number: 2

## 2023-03-26 ENCOUNTER — Ambulatory Visit
Admission: RE | Admit: 2023-03-26 | Discharge: 2023-03-26 | Disposition: A | Discharge status: Home / Self Care | Payer: Medicare HMO | Source: Ambulatory Visit | Attending: Radiation Oncology | Admitting: Radiation Oncology

## 2023-03-26 ENCOUNTER — Other Ambulatory Visit: Payer: Self-pay

## 2023-03-26 DIAGNOSIS — Z510 Encounter for antineoplastic radiation therapy: Secondary | ICD-10-CM | POA: Diagnosis not present

## 2023-03-26 LAB — RAD ONC ARIA SESSION SUMMARY
Course Elapsed Days: 2
Plan Fractions Treated to Date: 3
Plan Prescribed Dose Per Fraction: 2.66 Gy
Plan Total Fractions Prescribed: 16
Plan Total Prescribed Dose: 42.56 Gy
Reference Point Dosage Given to Date: 7.98 Gy
Reference Point Session Dosage Given: 2.66 Gy
Session Number: 3

## 2023-03-27 ENCOUNTER — Other Ambulatory Visit: Payer: Self-pay

## 2023-03-27 ENCOUNTER — Ambulatory Visit
Admission: RE | Admit: 2023-03-27 | Discharge: 2023-03-27 | Disposition: A | Discharge status: Home / Self Care | Payer: Medicare HMO | Source: Ambulatory Visit | Attending: Radiation Oncology | Admitting: Radiation Oncology

## 2023-03-27 DIAGNOSIS — Z510 Encounter for antineoplastic radiation therapy: Secondary | ICD-10-CM | POA: Diagnosis not present

## 2023-03-27 LAB — RAD ONC ARIA SESSION SUMMARY
Course Elapsed Days: 3
Plan Fractions Treated to Date: 4
Plan Prescribed Dose Per Fraction: 2.66 Gy
Plan Total Fractions Prescribed: 16
Plan Total Prescribed Dose: 42.56 Gy
Reference Point Dosage Given to Date: 10.64 Gy
Reference Point Session Dosage Given: 2.66 Gy
Session Number: 4

## 2023-03-28 ENCOUNTER — Ambulatory Visit
Admission: RE | Admit: 2023-03-28 | Discharge: 2023-03-28 | Disposition: A | Discharge status: Home / Self Care | Payer: Medicare HMO | Source: Ambulatory Visit | Attending: Radiation Oncology | Admitting: Radiation Oncology

## 2023-03-28 ENCOUNTER — Other Ambulatory Visit: Payer: Self-pay

## 2023-03-28 DIAGNOSIS — Z170 Estrogen receptor positive status [ER+]: Secondary | ICD-10-CM | POA: Insufficient documentation

## 2023-03-28 DIAGNOSIS — C50412 Malignant neoplasm of upper-outer quadrant of left female breast: Secondary | ICD-10-CM | POA: Insufficient documentation

## 2023-03-28 LAB — RAD ONC ARIA SESSION SUMMARY
Course Elapsed Days: 4
Plan Fractions Treated to Date: 5
Plan Prescribed Dose Per Fraction: 2.66 Gy
Plan Total Fractions Prescribed: 16
Plan Total Prescribed Dose: 42.56 Gy
Reference Point Dosage Given to Date: 13.3 Gy
Reference Point Session Dosage Given: 2.66 Gy
Session Number: 5

## 2023-03-28 MED ORDER — ALRA NON-METALLIC DEODORANT (RAD-ONC)
1.0000 | Freq: Once | TOPICAL | Status: AC
  Administered 2023-03-28: 1 via TOPICAL

## 2023-03-28 MED ORDER — RADIAPLEXRX EX GEL: Freq: Once | CUTANEOUS | Status: AC

## 2023-03-31 ENCOUNTER — Ambulatory Visit: Payer: Medicare HMO

## 2023-04-01 ENCOUNTER — Other Ambulatory Visit: Payer: Self-pay

## 2023-04-01 ENCOUNTER — Ambulatory Visit
Admission: RE | Admit: 2023-04-01 | Discharge: 2023-04-01 | Disposition: A | Discharge status: Home / Self Care | Payer: Medicare HMO | Source: Ambulatory Visit | Attending: Radiation Oncology | Admitting: Radiation Oncology

## 2023-04-01 DIAGNOSIS — C50412 Malignant neoplasm of upper-outer quadrant of left female breast: Secondary | ICD-10-CM | POA: Diagnosis not present

## 2023-04-01 LAB — RAD ONC ARIA SESSION SUMMARY
Course Elapsed Days: 8
Plan Fractions Treated to Date: 6
Plan Prescribed Dose Per Fraction: 2.66 Gy
Plan Total Fractions Prescribed: 16
Plan Total Prescribed Dose: 42.56 Gy
Reference Point Dosage Given to Date: 15.96 Gy
Reference Point Session Dosage Given: 2.66 Gy
Session Number: 6

## 2023-04-02 ENCOUNTER — Ambulatory Visit
Admission: RE | Admit: 2023-04-02 | Discharge: 2023-04-02 | Disposition: A | Discharge status: Home / Self Care | Payer: Medicare HMO | Source: Ambulatory Visit | Attending: Radiation Oncology | Admitting: Radiation Oncology

## 2023-04-02 ENCOUNTER — Other Ambulatory Visit: Payer: Self-pay

## 2023-04-02 DIAGNOSIS — C50412 Malignant neoplasm of upper-outer quadrant of left female breast: Secondary | ICD-10-CM | POA: Diagnosis not present

## 2023-04-02 LAB — RAD ONC ARIA SESSION SUMMARY
Course Elapsed Days: 9
Plan Fractions Treated to Date: 7
Plan Prescribed Dose Per Fraction: 2.66 Gy
Plan Total Fractions Prescribed: 16
Plan Total Prescribed Dose: 42.56 Gy
Reference Point Dosage Given to Date: 18.62 Gy
Reference Point Session Dosage Given: 2.66 Gy
Session Number: 7

## 2023-04-03 ENCOUNTER — Other Ambulatory Visit: Payer: Self-pay

## 2023-04-03 ENCOUNTER — Ambulatory Visit
Admission: RE | Admit: 2023-04-03 | Discharge: 2023-04-03 | Disposition: A | Discharge status: Home / Self Care | Payer: Medicare HMO | Source: Ambulatory Visit | Attending: Radiation Oncology | Admitting: Radiation Oncology

## 2023-04-03 DIAGNOSIS — C50412 Malignant neoplasm of upper-outer quadrant of left female breast: Secondary | ICD-10-CM | POA: Diagnosis not present

## 2023-04-03 LAB — RAD ONC ARIA SESSION SUMMARY
Course Elapsed Days: 10
Plan Fractions Treated to Date: 8
Plan Prescribed Dose Per Fraction: 2.66 Gy
Plan Total Fractions Prescribed: 16
Plan Total Prescribed Dose: 42.56 Gy
Reference Point Dosage Given to Date: 21.28 Gy
Reference Point Session Dosage Given: 2.66 Gy
Session Number: 8

## 2023-04-04 ENCOUNTER — Ambulatory Visit: Payer: Medicare HMO

## 2023-04-07 ENCOUNTER — Other Ambulatory Visit: Payer: Self-pay

## 2023-04-07 ENCOUNTER — Ambulatory Visit
Admission: RE | Admit: 2023-04-07 | Discharge: 2023-04-07 | Disposition: A | Discharge status: Home / Self Care | Payer: Medicare HMO | Source: Ambulatory Visit | Attending: Radiation Oncology

## 2023-04-07 DIAGNOSIS — C50412 Malignant neoplasm of upper-outer quadrant of left female breast: Secondary | ICD-10-CM | POA: Diagnosis not present

## 2023-04-07 LAB — RAD ONC ARIA SESSION SUMMARY
Course Elapsed Days: 14
Plan Fractions Treated to Date: 9
Plan Prescribed Dose Per Fraction: 2.66 Gy
Plan Total Fractions Prescribed: 16
Plan Total Prescribed Dose: 42.56 Gy
Reference Point Dosage Given to Date: 23.94 Gy
Reference Point Session Dosage Given: 2.66 Gy
Session Number: 9

## 2023-04-08 ENCOUNTER — Other Ambulatory Visit: Payer: Self-pay

## 2023-04-08 ENCOUNTER — Ambulatory Visit
Admission: RE | Admit: 2023-04-08 | Discharge: 2023-04-08 | Disposition: A | Discharge status: Home / Self Care | Payer: Medicare HMO | Source: Ambulatory Visit | Attending: Radiation Oncology | Admitting: Radiation Oncology

## 2023-04-08 ENCOUNTER — Ambulatory Visit
Admission: RE | Admit: 2023-04-08 | Discharge: 2023-04-08 | Disposition: A | Discharge status: Home / Self Care | Payer: Medicare HMO | Source: Ambulatory Visit | Attending: Radiation Oncology

## 2023-04-08 DIAGNOSIS — C50412 Malignant neoplasm of upper-outer quadrant of left female breast: Secondary | ICD-10-CM | POA: Diagnosis not present

## 2023-04-08 LAB — RAD ONC ARIA SESSION SUMMARY
Course Elapsed Days: 15
Plan Fractions Treated to Date: 10
Plan Prescribed Dose Per Fraction: 2.66 Gy
Plan Total Fractions Prescribed: 16
Plan Total Prescribed Dose: 42.56 Gy
Reference Point Dosage Given to Date: 26.6 Gy
Reference Point Session Dosage Given: 2.66 Gy
Session Number: 10

## 2023-04-09 ENCOUNTER — Ambulatory Visit
Admission: RE | Admit: 2023-04-09 | Discharge: 2023-04-09 | Disposition: A | Discharge status: Home / Self Care | Payer: Medicare HMO | Source: Ambulatory Visit | Attending: Radiation Oncology

## 2023-04-09 ENCOUNTER — Other Ambulatory Visit: Payer: Self-pay

## 2023-04-09 DIAGNOSIS — C50412 Malignant neoplasm of upper-outer quadrant of left female breast: Secondary | ICD-10-CM | POA: Diagnosis not present

## 2023-04-09 LAB — RAD ONC ARIA SESSION SUMMARY
Course Elapsed Days: 16
Plan Fractions Treated to Date: 11
Plan Prescribed Dose Per Fraction: 2.66 Gy
Plan Total Fractions Prescribed: 16
Plan Total Prescribed Dose: 42.56 Gy
Reference Point Dosage Given to Date: 29.26 Gy
Reference Point Session Dosage Given: 2.66 Gy
Session Number: 11

## 2023-04-10 ENCOUNTER — Other Ambulatory Visit: Payer: Self-pay

## 2023-04-10 ENCOUNTER — Ambulatory Visit
Admission: RE | Admit: 2023-04-10 | Discharge: 2023-04-10 | Disposition: A | Discharge status: Home / Self Care | Payer: Medicare HMO | Source: Ambulatory Visit | Attending: Radiation Oncology | Admitting: Radiation Oncology

## 2023-04-10 DIAGNOSIS — C50412 Malignant neoplasm of upper-outer quadrant of left female breast: Secondary | ICD-10-CM | POA: Diagnosis not present

## 2023-04-10 LAB — RAD ONC ARIA SESSION SUMMARY
Course Elapsed Days: 17
Plan Fractions Treated to Date: 12
Plan Prescribed Dose Per Fraction: 2.66 Gy
Plan Total Fractions Prescribed: 16
Plan Total Prescribed Dose: 42.56 Gy
Reference Point Dosage Given to Date: 31.92 Gy
Reference Point Session Dosage Given: 2.66 Gy
Session Number: 12

## 2023-04-11 ENCOUNTER — Ambulatory Visit: Payer: Medicare HMO | Admitting: Radiation Oncology

## 2023-04-11 ENCOUNTER — Ambulatory Visit
Admission: RE | Admit: 2023-04-11 | Discharge: 2023-04-11 | Disposition: A | Discharge status: Home / Self Care | Payer: Medicare HMO | Source: Ambulatory Visit | Attending: Radiation Oncology | Admitting: Radiation Oncology

## 2023-04-11 ENCOUNTER — Other Ambulatory Visit: Payer: Self-pay

## 2023-04-11 DIAGNOSIS — C50412 Malignant neoplasm of upper-outer quadrant of left female breast: Secondary | ICD-10-CM | POA: Diagnosis not present

## 2023-04-11 LAB — RAD ONC ARIA SESSION SUMMARY
Course Elapsed Days: 18
Plan Fractions Treated to Date: 13
Plan Prescribed Dose Per Fraction: 2.66 Gy
Plan Total Fractions Prescribed: 16
Plan Total Prescribed Dose: 42.56 Gy
Reference Point Dosage Given to Date: 34.58 Gy
Reference Point Session Dosage Given: 2.66 Gy
Session Number: 13

## 2023-04-14 ENCOUNTER — Ambulatory Visit: Payer: Medicare HMO

## 2023-04-14 ENCOUNTER — Other Ambulatory Visit: Payer: Self-pay

## 2023-04-14 ENCOUNTER — Ambulatory Visit
Admission: RE | Admit: 2023-04-14 | Discharge: 2023-04-14 | Disposition: A | Discharge status: Home / Self Care | Payer: Medicare HMO | Source: Ambulatory Visit | Attending: Radiation Oncology | Admitting: Radiation Oncology

## 2023-04-14 DIAGNOSIS — C50412 Malignant neoplasm of upper-outer quadrant of left female breast: Secondary | ICD-10-CM | POA: Diagnosis not present

## 2023-04-14 LAB — RAD ONC ARIA SESSION SUMMARY
Course Elapsed Days: 21
Plan Fractions Treated to Date: 14
Plan Prescribed Dose Per Fraction: 2.66 Gy
Plan Total Fractions Prescribed: 16
Plan Total Prescribed Dose: 42.56 Gy
Reference Point Dosage Given to Date: 37.24 Gy
Reference Point Session Dosage Given: 2.66 Gy
Session Number: 14

## 2023-04-15 ENCOUNTER — Inpatient Hospital Stay (HOSPITAL_BASED_OUTPATIENT_CLINIC_OR_DEPARTMENT_OTHER): Payer: Medicare HMO | Admitting: Hematology and Oncology

## 2023-04-15 ENCOUNTER — Other Ambulatory Visit: Payer: Self-pay

## 2023-04-15 ENCOUNTER — Ambulatory Visit
Admission: RE | Admit: 2023-04-15 | Discharge: 2023-04-15 | Disposition: A | Discharge status: Home / Self Care | Payer: Medicare HMO | Source: Ambulatory Visit | Attending: Radiation Oncology | Admitting: Radiation Oncology

## 2023-04-15 ENCOUNTER — Ambulatory Visit: Payer: Medicare HMO

## 2023-04-15 VITALS — BP 144/58 | HR 60 | Temp 97.3°F | Resp 18 | Ht 67.0 in | Wt 243.1 lb

## 2023-04-15 DIAGNOSIS — Z1732 Human epidermal growth factor receptor 2 negative status: Secondary | ICD-10-CM | POA: Insufficient documentation

## 2023-04-15 DIAGNOSIS — Z923 Personal history of irradiation: Secondary | ICD-10-CM | POA: Insufficient documentation

## 2023-04-15 DIAGNOSIS — C50412 Malignant neoplasm of upper-outer quadrant of left female breast: Secondary | ICD-10-CM

## 2023-04-15 DIAGNOSIS — Z170 Estrogen receptor positive status [ER+]: Secondary | ICD-10-CM | POA: Insufficient documentation

## 2023-04-15 DIAGNOSIS — Z79811 Long term (current) use of aromatase inhibitors: Secondary | ICD-10-CM | POA: Insufficient documentation

## 2023-04-15 DIAGNOSIS — Z1721 Progesterone receptor positive status: Secondary | ICD-10-CM | POA: Insufficient documentation

## 2023-04-15 LAB — RAD ONC ARIA SESSION SUMMARY
Course Elapsed Days: 22
Plan Fractions Treated to Date: 15
Plan Prescribed Dose Per Fraction: 2.66 Gy
Plan Total Fractions Prescribed: 16
Plan Total Prescribed Dose: 42.56 Gy
Reference Point Dosage Given to Date: 39.9 Gy
Reference Point Session Dosage Given: 2.66 Gy
Session Number: 15

## 2023-04-15 MED ORDER — ANASTROZOLE 1 MG PO TABS: 1.0000 mg | ORAL_TABLET | Freq: Every day | ORAL | 3 refills | Status: DC

## 2023-04-15 NOTE — Progress Notes (Signed)
Patient Care Team: Henrine Screws, MD as PCP - General (Family Medicine) Donnelly Angelica, RN as Oncology Nurse Navigator Pershing Proud, RN as Oncology Nurse Navigator Abigail Miyamoto, MD as Consulting Physician (General Surgery) Serena Croissant, MD as Consulting Physician (Hematology and Oncology)  DIAGNOSIS:  Encounter Diagnosis  Name Primary?   Malignant neoplasm of upper-outer quadrant of left breast in female, estrogen receptor positive (HCC) Yes    SUMMARY OF ONCOLOGIC HISTORY: Oncology History  Malignant neoplasm of upper-outer quadrant of left breast in female, estrogen receptor positive (HCC)  01/07/2023 Initial Diagnosis   Screening mammogram detected left breast mass (0.9 cm) with distortion and calcifications spanning 1.8 cm posterior depth, axilla negative; biopsy of the mass: Grade 2 IDC ER 90%, PR 70%, HER2 2+ by IHC, FISH negative, Ki-67 1%   01/13/2023 Cancer Staging   Staging form: Breast, AJCC 8th Edition - Clinical: Stage IA (cT1b, cN0, cM0, G2, ER+, PR+, HER2-) - Signed by Serena Croissant, MD on 01/13/2023 Stage prefix: Initial diagnosis Histologic grading system: 3 grade system   01/23/2023 Surgery   Left lumpectomy: Grade 2 IDC 1.4 cm with grade 1-2 DCIS, margins negative, LVI not identified, ER 90%, PR 70%, HER2 negative by FISH, Ki-67 1%    02/19/2023 Oncotype testing   Oncotype score of 27 (16% risk of recurrence)   03/25/2023 - 04/22/2023 Radiation Therapy   Adjuvant radiation     CHIEF COMPLIANT: Follow-up towards end of radiation  HISTORY OF PRESENT ILLNESS:   History of Present Illness   The patient, who is currently undergoing radiation treatment for breast cancer, reports that she is experiencing some sensitivity and redness in the area being treated. She also mentions feeling fatigued and going to bed earlier than usual. Despite these symptoms, she is able to drive herself to and from her treatments and take care of routine tasks. She is due  to complete her radiation treatment in the coming week.  In addition to her cancer treatment, the patient is also trying to manage her weight. She reports walking one to two miles a day and trying to learn meditation through yoga to help manage her anxiety and blood pressure. Despite these efforts, she has not seen significant weight loss and is considering other options, including weight loss medications and bariatric surgery.         ALLERGIES:  is allergic to doxycycline, gluten meal, and lisinopril.  MEDICATIONS:  Current Outpatient Medications  Medication Sig Dispense Refill   anastrozole (ARIMIDEX) 1 MG tablet Take 1 tablet (1 mg total) by mouth daily. 90 tablet 3   acetaminophen (TYLENOL) 500 MG tablet Take 1,000-1,500 mg by mouth every 6 (six) hours as needed for moderate pain or mild pain. (Patient not taking: Reported on 02/11/2023)     ALPRAZolam (XANAX) 0.5 MG tablet Take 0.5 mg by mouth 2 (two) times daily as needed for anxiety.     amLODipine (NORVASC) 5 MG tablet Take 5 mg by mouth every 12 (twelve) hours.     ASPIRIN LOW DOSE 81 MG tablet Take 81 mg by mouth every other day.     atorvastatin (LIPITOR) 20 MG tablet Take 20 mg by mouth daily.     citalopram (CELEXA) 20 MG tablet Take 20-40 mg by mouth at bedtime as needed (Sleep).     diltiazem (CARDIZEM SR) 120 MG 12 hr capsule Take by mouth.     hyoscyamine (ANASPAZ) 0.125 MG TBDP disintergrating tablet Place 0.125 mg under the tongue every 6 (  six) hours as needed for cramping.     ibuprofen (ADVIL) 200 MG tablet Take 200 mg by mouth every 6 (six) hours as needed for moderate pain or mild pain. (Patient not taking: Reported on 02/11/2023)     isosorbide mononitrate (IMDUR) 60 MG 24 hr tablet Take 60 mg by mouth 2 (two) times daily.     ketoconazole (NIZORAL) 2 % cream Apply 1 Application topically daily as needed for irritation (Psoriasis).     levothyroxine (SYNTHROID) 112 MCG tablet Take 100 mcg by mouth daily.     magnesium  gluconate (MAGONATE) 500 MG tablet Take 500 mg by mouth once a week.     montelukast (SINGULAIR) 10 MG tablet Take 10 mg by mouth at bedtime.     nebivolol (BYSTOLIC) 5 MG tablet Take 5 mg by mouth daily.     OVER THE COUNTER MEDICATION Take 1 tablet by mouth once a week. Omega xl     Probiotic Product (PROBIOTIC PO) Take 2 capsules by mouth daily as needed (Constipation). Emma     sodium chloride (OCEAN) 0.65 % SOLN nasal spray Place 1-2 sprays into both nostrils as needed for congestion.     sulconazole nitrate 1 % external solution Apply 1 application  topically daily as needed (Psoriasis).     No current facility-administered medications for this visit.    PHYSICAL EXAMINATION: ECOG PERFORMANCE STATUS: 1 - Symptomatic but completely ambulatory  Vitals:   04/15/23 1314  BP: (!) 144/58  Pulse: 60  Resp: 18  Temp: (!) 97.3 F (36.3 C)  SpO2: 100%   Filed Weights   04/15/23 1314  Weight: 243 lb 1.6 oz (110.3 kg)      LABORATORY DATA:  I have reviewed the data as listed    Latest Ref Rng & Units 01/13/2023   10:49 AM 08/29/2020    5:16 AM 12/21/2019   12:25 PM  CMP  Glucose 70 - 99 mg/dL 409  811  914   BUN 8 - 23 mg/dL 17  9  6    Creatinine 0.44 - 1.00 mg/dL 7.82  9.56  2.13   Sodium 135 - 145 mmol/L 139  139  139   Potassium 3.5 - 5.1 mmol/L 4.0  3.8  4.3   Chloride 98 - 111 mmol/L 106  104  104   CO2 22 - 32 mmol/L 27  24  27    Calcium 8.9 - 10.3 mg/dL 9.6  9.3  9.2   Total Protein 6.5 - 8.1 g/dL 7.8  7.5    Total Bilirubin 0.3 - 1.2 mg/dL 0.5  0.4    Alkaline Phos 38 - 126 U/L 85  80    AST 15 - 41 U/L 20  17    ALT 0 - 44 U/L 18  12      Lab Results  Component Value Date   WBC 9.0 01/13/2023   HGB 12.9 01/13/2023   HCT 39.5 01/13/2023   MCV 97.5 01/13/2023   PLT 297 01/13/2023   NEUTROABS 4.7 01/13/2023    ASSESSMENT & PLAN:  Malignant neoplasm of upper-outer quadrant of left breast in female, estrogen receptor positive (HCC) 01/23/2023: Left lumpectomy:  Grade 2 IDC 1.4 cm with grade 1-2 DCIS, margins negative, LVI not identified, ER 90%, PR 70%, HER2 negative by FISH, Ki-67 1%  Oncotype score of 27 (16% risk of recurrence)   Treatment plan: I did not recommend adjuvant chemotherapy because of her age, performance status, mental state as I have  a strong suspicion that she would not be able to handle chemotherapy due to mental health concerns and lack of support Adjuvant radiation therapy to be completed 04/02/2023 Followed by adjuvant antiestrogen therapy ---------------------------------------------------------------------------------------------------------------- Anastrozole counseling: We discussed the risks and benefits of anti-estrogen therapy with aromatase inhibitors. These include but not limited to insomnia, hot flashes, mood changes, vaginal dryness, bone density loss, and weight gain. We strongly believe that the benefits far outweigh the risks. Patient understands these risks and consented to starting treatment. Planned treatment duration is 5 years.   Weight Management Expressed desire for weight loss. Discussed potential use of Ozempic, but this would need to be prescribed by primary care physician. -Consider discussing Ozempic with primary care physician for weight loss. -Encouraged to use a calorie tracking app to monitor caloric intake and expenditure.  Follow-up Survivorship care plan visit in 3 months     No orders of the defined types were placed in this encounter.  The patient has a good understanding of the overall plan. she agrees with it. she will call with any problems that may develop before the next visit here. Total time spent: 30 mins including face to face time and time spent for planning, charting and co-ordination of care   Tamsen Meek, MD 04/15/23

## 2023-04-15 NOTE — Assessment & Plan Note (Signed)
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, margins negative, LVI not identified, ER 90%, PR 70%, HER2 negative by FISH, Ki-67 1%  Oncotype score of 27 (16% risk of recurrence)   Treatment plan: I did not recommend adjuvant chemotherapy because of her age, performance status, mental state as I have a strong suspicion that she would not be able to handle chemotherapy due to mental health concerns and lack of support Adjuvant radiation therapy to be completed 04/02/2023 Followed by adjuvant antiestrogen therapy ---------------------------------------------------------------------------------------------------------------- Anastrozole counseling: We discussed the risks and benefits of anti-estrogen therapy with aromatase inhibitors. These include but not limited to insomnia, hot flashes, mood changes, vaginal dryness, bone density loss, and weight gain. We strongly believe that the benefits far outweigh the risks. Patient understands these risks and consented to starting treatment. Planned treatment duration is 5 years.  Return to clinic in 3 months for survivorship care plan visit

## 2023-04-16 ENCOUNTER — Other Ambulatory Visit: Payer: Self-pay

## 2023-04-16 ENCOUNTER — Ambulatory Visit: Payer: Medicare HMO

## 2023-04-16 ENCOUNTER — Ambulatory Visit
Admission: RE | Admit: 2023-04-16 | Discharge: 2023-04-16 | Disposition: A | Discharge status: Home / Self Care | Payer: Medicare HMO | Source: Ambulatory Visit | Attending: Radiation Oncology

## 2023-04-16 DIAGNOSIS — C50412 Malignant neoplasm of upper-outer quadrant of left female breast: Secondary | ICD-10-CM | POA: Diagnosis not present

## 2023-04-16 LAB — RAD ONC ARIA SESSION SUMMARY
Course Elapsed Days: 23
Plan Fractions Treated to Date: 16
Plan Prescribed Dose Per Fraction: 2.66 Gy
Plan Total Fractions Prescribed: 16
Plan Total Prescribed Dose: 42.56 Gy
Reference Point Dosage Given to Date: 42.56 Gy
Reference Point Session Dosage Given: 2.66 Gy
Session Number: 16

## 2023-04-17 ENCOUNTER — Ambulatory Visit: Payer: Medicare HMO

## 2023-04-17 ENCOUNTER — Other Ambulatory Visit: Payer: Self-pay

## 2023-04-17 ENCOUNTER — Ambulatory Visit
Admission: RE | Admit: 2023-04-17 | Discharge: 2023-04-17 | Disposition: A | Discharge status: Home / Self Care | Payer: Medicare HMO | Source: Ambulatory Visit | Attending: Radiation Oncology | Admitting: Radiation Oncology

## 2023-04-17 DIAGNOSIS — C50412 Malignant neoplasm of upper-outer quadrant of left female breast: Secondary | ICD-10-CM | POA: Diagnosis not present

## 2023-04-17 LAB — RAD ONC ARIA SESSION SUMMARY
Course Elapsed Days: 24
Plan Fractions Treated to Date: 1
Plan Prescribed Dose Per Fraction: 2 Gy
Plan Total Fractions Prescribed: 4
Plan Total Prescribed Dose: 8 Gy
Reference Point Dosage Given to Date: 2 Gy
Reference Point Session Dosage Given: 2 Gy
Session Number: 17

## 2023-04-18 ENCOUNTER — Ambulatory Visit
Admission: RE | Admit: 2023-04-18 | Discharge: 2023-04-18 | Disposition: A | Discharge status: Home / Self Care | Payer: Medicare HMO | Source: Ambulatory Visit | Attending: Radiation Oncology | Admitting: Radiation Oncology

## 2023-04-18 ENCOUNTER — Ambulatory Visit: Payer: Medicare HMO

## 2023-04-18 ENCOUNTER — Other Ambulatory Visit: Payer: Self-pay

## 2023-04-18 ENCOUNTER — Ambulatory Visit
Admission: RE | Admit: 2023-04-18 | Discharge: 2023-04-18 | Disposition: A | Discharge status: Home / Self Care | Payer: Medicare HMO | Source: Ambulatory Visit | Attending: Radiation Oncology

## 2023-04-18 DIAGNOSIS — C50412 Malignant neoplasm of upper-outer quadrant of left female breast: Secondary | ICD-10-CM | POA: Diagnosis not present

## 2023-04-18 LAB — RAD ONC ARIA SESSION SUMMARY
Course Elapsed Days: 25
Plan Fractions Treated to Date: 2
Plan Prescribed Dose Per Fraction: 2 Gy
Plan Total Fractions Prescribed: 4
Plan Total Prescribed Dose: 8 Gy
Reference Point Dosage Given to Date: 4 Gy
Reference Point Session Dosage Given: 2 Gy
Session Number: 18

## 2023-04-21 ENCOUNTER — Other Ambulatory Visit: Payer: Self-pay

## 2023-04-21 ENCOUNTER — Ambulatory Visit: Payer: Medicare HMO

## 2023-04-21 ENCOUNTER — Ambulatory Visit
Admission: RE | Admit: 2023-04-21 | Discharge: 2023-04-21 | Disposition: A | Discharge status: Home / Self Care | Payer: Medicare HMO | Source: Ambulatory Visit | Attending: Radiation Oncology | Admitting: Radiation Oncology

## 2023-04-21 DIAGNOSIS — C50412 Malignant neoplasm of upper-outer quadrant of left female breast: Secondary | ICD-10-CM | POA: Diagnosis not present

## 2023-04-21 LAB — RAD ONC ARIA SESSION SUMMARY
Course Elapsed Days: 28
Plan Fractions Treated to Date: 3
Plan Prescribed Dose Per Fraction: 2 Gy
Plan Total Fractions Prescribed: 4
Plan Total Prescribed Dose: 8 Gy
Reference Point Dosage Given to Date: 6 Gy
Reference Point Session Dosage Given: 2 Gy
Session Number: 19

## 2023-04-22 ENCOUNTER — Ambulatory Visit
Admission: RE | Admit: 2023-04-22 | Discharge: 2023-04-22 | Disposition: A | Discharge status: Home / Self Care | Payer: Medicare HMO | Source: Ambulatory Visit | Attending: Radiation Oncology

## 2023-04-22 ENCOUNTER — Other Ambulatory Visit: Payer: Self-pay

## 2023-04-22 DIAGNOSIS — C50412 Malignant neoplasm of upper-outer quadrant of left female breast: Secondary | ICD-10-CM | POA: Diagnosis not present

## 2023-04-22 LAB — RAD ONC ARIA SESSION SUMMARY
Course Elapsed Days: 29
Plan Fractions Treated to Date: 4
Plan Prescribed Dose Per Fraction: 2 Gy
Plan Total Fractions Prescribed: 4
Plan Total Prescribed Dose: 8 Gy
Reference Point Dosage Given to Date: 8 Gy
Reference Point Session Dosage Given: 2 Gy
Session Number: 20

## 2023-04-23 NOTE — Radiation Completion Notes (Addendum)
  Radiation Oncology         [phone] ________________________________  Name: Ruth Marquez MRN: [id number]  Date of Service: 04/22/2023  DOB: [birth year]  End of Treatment Note     Diagnosis: Stage IA, pT1b, cN0M0, grade 2, ER/PR positive invasive ductal carcinoma of the left breast   Intent: Curative     ==========DELIVERED PLANS==========  First Treatment Date: 2023-03-24 - Last Treatment Date: 2023-04-22   Plan Name: Breast_L Site: Breast, Left Technique: 3D Mode: Photon Dose Per Fraction: 2.66 Gy Prescribed Dose (Delivered / Prescribed): 42.56 Gy / 42.56 Gy Prescribed Fxs (Delivered / Prescribed): 16 / 16   Plan Name: Breast_L_Bst Site: Breast, Left Technique: 3D Mode: Photon Dose Per Fraction: 2 Gy Prescribed Dose (Delivered / Prescribed): 8 Gy / 8 Gy Prescribed Fxs (Delivered / Prescribed): 4 / 4     ==========ON TREATMENT VISIT DATES========== 2023-03-28, 2023-04-08, 2023-04-11, 2023-04-18   See weekly On Treatment Notes in Epic for details. The patient tolerated radiation. She developed fatigue and anticipated skin changes in the treatment field.   The patient will receive a call in about one month from the radiation oncology department. She will continue follow up with Dr. Pamelia Hoit as well.      Osker Mason, PAC

## 2023-05-15 ENCOUNTER — Telehealth: Payer: Self-pay

## 2023-05-15 NOTE — Telephone Encounter (Signed)
Pt called to find out when she should start AI therapy. Advised she should have started after finishing xrt. She states she will and asked for education on side effects. She was educated and advised of her appt with [facility] 07/21/23 She knows to call if she has debilitating side effects from AI therapy.

## 2023-05-19 ENCOUNTER — Ambulatory Visit
Admission: RE | Admit: 2023-05-19 | Discharge: 2023-05-19 | Disposition: A | Discharge status: Home / Self Care | Payer: Medicare HMO | Source: Ambulatory Visit | Attending: Adult Health | Admitting: Adult Health

## 2023-05-19 DIAGNOSIS — C50412 Malignant neoplasm of upper-outer quadrant of left female breast: Secondary | ICD-10-CM | POA: Insufficient documentation

## 2023-05-19 DIAGNOSIS — Z170 Estrogen receptor positive status [ER+]: Secondary | ICD-10-CM | POA: Insufficient documentation

## 2023-05-19 NOTE — Progress Notes (Signed)
  Radiation Oncology         [phone] ________________________________  Name: Ruth Marquez MRN: [id number]  Date of Service: 05/19/2023  DOB: March 03, 1946  Post Treatment Telephone Note  Diagnosis:  Stage IA, pT1b, cN0M0, grade 2, ER/PR positive invasive ductal carcinoma of the left breast (as documented in provider EOT note)  The patient was available for call today.   Symptoms of fatigue have improved since completing therapy.  Symptoms of skin changes have improved since completing therapy.  The patient was encouraged to avoid sun exposure in the area of prior treatment for up to one year following radiation with either sunscreen or by the style of clothing worn in the sun.  The patient has scheduled follow up with her medical oncologist Dr. Pamelia Hoit for ongoing surveillance, and was encouraged to call if she develops concerns or questions regarding radiation.   This concludes the interaction.  Ruel Favors, LPN

## 2023-06-03 ENCOUNTER — Ambulatory Visit
Admission: RE | Admit: 2023-06-03 | Discharge: 2023-06-03 | Disposition: A | Discharge status: Home / Self Care | Payer: Medicare HMO | Source: Ambulatory Visit | Attending: Family Medicine | Admitting: Family Medicine

## 2023-06-03 DIAGNOSIS — M85851 Other specified disorders of bone density and structure, right thigh: Secondary | ICD-10-CM

## 2023-07-11 ENCOUNTER — Telehealth: Payer: Self-pay | Admitting: *Deleted

## 2023-07-11 NOTE — Telephone Encounter (Signed)
Received call from with complaint of hot flashes, jitteriness, increased nervousness and GI upset.  Pt states she discussed symptoms with her cardiologist who recommended she contact our office. RN reviewed with MD and verbal orders received for pt to hold Anastrozole x2 weeks and contact our office for symptom update in 2 weeks.  If symptoms improve, pt to be prescribed Letrozole.  If symptoms continue, pt to re-start Anastrozole and f/u with PCP.  Pt educated and verbalized understanding.

## 2023-07-21 ENCOUNTER — Encounter: Payer: Self-pay | Admitting: Adult Health

## 2023-07-21 ENCOUNTER — Inpatient Hospital Stay: Payer: Medicare HMO | Attending: Adult Health | Admitting: Adult Health

## 2023-07-21 ENCOUNTER — Other Ambulatory Visit: Payer: Self-pay

## 2023-07-21 VITALS — BP 152/63 | HR 66 | Temp 98.2°F | Resp 19 | Ht 67.0 in | Wt 245.2 lb

## 2023-07-21 DIAGNOSIS — Z1732 Human epidermal growth factor receptor 2 negative status: Secondary | ICD-10-CM

## 2023-07-21 DIAGNOSIS — Z90710 Acquired absence of both cervix and uterus: Secondary | ICD-10-CM | POA: Insufficient documentation

## 2023-07-21 DIAGNOSIS — M85851 Other specified disorders of bone density and structure, right thigh: Secondary | ICD-10-CM | POA: Diagnosis not present

## 2023-07-21 DIAGNOSIS — Z170 Estrogen receptor positive status [ER+]: Secondary | ICD-10-CM

## 2023-07-21 DIAGNOSIS — Z923 Personal history of irradiation: Secondary | ICD-10-CM | POA: Diagnosis not present

## 2023-07-21 DIAGNOSIS — Z87891 Personal history of nicotine dependence: Secondary | ICD-10-CM | POA: Diagnosis not present

## 2023-07-21 DIAGNOSIS — C50412 Malignant neoplasm of upper-outer quadrant of left female breast: Secondary | ICD-10-CM | POA: Diagnosis present

## 2023-07-21 DIAGNOSIS — Z79811 Long term (current) use of aromatase inhibitors: Secondary | ICD-10-CM

## 2023-07-21 DIAGNOSIS — Z1721 Progesterone receptor positive status: Secondary | ICD-10-CM

## 2023-07-21 MED ORDER — LETROZOLE 2.5 MG PO TABS: 2.5000 mg | ORAL_TABLET | Freq: Every day | ORAL | 3 refills | Status: DC

## 2023-07-21 NOTE — Progress Notes (Signed)
 SURVIVORSHIP VISIT:  BRIEF ONCOLOGIC HISTORY:  Oncology History  Malignant neoplasm of upper-outer quadrant of left breast in female, estrogen receptor positive (HCC)  01/07/2023 Initial Diagnosis   Screening mammogram detected left breast mass (0.9 cm) with distortion and calcifications spanning 1.8 cm posterior depth, axilla negative; biopsy of the mass: Grade 2 IDC ER 90%, PR 70%, HER2 2+ by IHC, FISH negative, Ki-67 1%   01/13/2023 Cancer Staging   Staging form: Breast, AJCC 8th Edition - Clinical: Stage IA (cT1b, cN0, cM0, G2, ER+, PR+, HER2-) - Signed by Serena Croissant, MD on 01/13/2023 Stage prefix: Initial diagnosis Histologic grading system: 3 grade system   01/23/2023 Surgery   Left lumpectomy: Grade 2 IDC 1.4 cm with grade 1-2 DCIS, margins negative, LVI not identified, ER 90%, PR 70%, HER2 negative by FISH, Ki-67 1%    02/19/2023 Oncotype testing   Oncotype score of 27 (16% risk of recurrence)   03/25/2023 - 04/22/2023 Radiation Therapy   Plan Name: Breast_L Site: Breast, Left Technique: 3D Mode: Photon Dose Per Fraction: 2.66 Gy Prescribed Dose (Delivered / Prescribed): 42.56 Gy / 42.56 Gy Prescribed Fxs (Delivered / Prescribed): 16 / 16   Plan Name: Breast_L_Bst Site: Breast, Left Technique: 3D Mode: Photon Dose Per Fraction: 2 Gy Prescribed Dose (Delivered / Prescribed): 8 Gy / 8 Gy Prescribed Fxs (Delivered / Prescribed): 4 / 4   03/2023 -  Anti-estrogen oral therapy   Anastrozole x 5 years     INTERVAL HISTORY:  Ruth Marquez to review her survivorship care plan detailing her treatment course for breast cancer, as well as monitoring long-term side effects of that treatment, education regarding health maintenance, screening, and overall wellness and health promotion.     Overall, Ruth Marquez reports feeling quite well   REVIEW OF SYSTEMS:  Review of Systems  Constitutional:  Negative for appetite change, chills, fatigue, fever and unexpected weight change.   HENT:   Negative for hearing loss, lump/mass and trouble swallowing.   Eyes:  Negative for eye problems and icterus.  Respiratory:  Negative for chest tightness, cough and shortness of breath.   Cardiovascular:  Negative for chest pain, leg swelling and palpitations.  Gastrointestinal:  Negative for abdominal distention, abdominal pain, constipation, diarrhea, nausea and vomiting.  Endocrine: Negative for hot flashes.  Genitourinary:  Negative for difficulty urinating.   Musculoskeletal:  Negative for arthralgias.  Skin:  Negative for itching and rash.  Neurological:  Negative for dizziness, extremity weakness, headaches and numbness.  Hematological:  Negative for adenopathy. Does not bruise/bleed easily.  Psychiatric/Behavioral:  Negative for depression. The patient is not nervous/anxious.    Breast: Denies any new nodularity, masses, tenderness, nipple changes, or nipple discharge.       PAST MEDICAL/SURGICAL HISTORY:  Past Medical History:  Diagnosis Date  . Anxiety   . Arthritis    Bilateral Knees  . Cancer [facility]) 2024   Left Breast Cancer  . Headache    Hx  . Hypertension   . Hypothyroidism   . Palpitations    PSVT runs, PSVC burden 1.2% 09/2022 Holter monitor  . Pneumonia 2005  . Pneumonia due to COVID-19 virus 2023  . Pre-diabetes   . Sleep apnea    Newly dx 2024. Has not received CPAP yet  . Thyroid disease    Past Surgical History:  Procedure Laterality Date  . ABDOMINAL HYSTERECTOMY    . APPENDECTOMY    . BREAST BIOPSY Left 01/07/2023   [place] LT BREAST BX W LOC DEV  1ST LESION IMG BX SPEC US GUIDE 01/07/2023 [facility]MO[facility]  . BREAST BIOPSY Left 01/07/2023   MM LT BREAST BX W LOC DEV 1ST LESION IMAGE BX SPEC STEREO GUIDE 01/07/2023 [facility]MO[facility]  . BREAST BIOPSY  01/21/2023   MM LT RADIOACTIVE SEED LOC MAMMO GUIDE 01/21/2023 [facility]  . BREAST LUMPECTOMY WITH RADIOACTIVE SEED LOCALIZATION Left 01/23/2023   Procedure: RADIOACTIVE SEED GUIDED LEFT  BREAST LUMPECTOMY;  Surgeon: Abigail Miyamoto, MD;  Location: [facility] OR;  Service: General;  Laterality: Left;  LMA  . BREAST REDUCTION SURGERY Bilateral   . CHOLECYSTECTOMY       ALLERGIES:  Allergies  Allergen Reactions  . Doxycycline Nausea And Vomiting  . Gluten Meal Other (See Comments)  . Lisinopril Cough     CURRENT MEDICATIONS:  Outpatient Encounter Medications as of 07/21/2023  Medication Sig Note  . losartan (COZAAR) 50 MG tablet Take 50 mg by mouth daily.   Marland Kitchen acetaminophen (TYLENOL) 500 MG tablet Take 1,000-1,500 mg by mouth every 6 (six) hours as needed for moderate pain (pain score 4-6) or mild pain (pain score 1-3).   Marland Kitchen ALPRAZolam (XANAX) 0.5 MG tablet Take 0.5 mg by mouth 2 (two) times daily as needed for anxiety.   Marland Kitchen anastrozole (ARIMIDEX) 1 MG tablet Take 1 tablet (1 mg total) by mouth daily. (Patient taking differently: Take 1 mg by mouth daily. On hold)   . ASPIRIN LOW DOSE 81 MG tablet Take 81 mg by mouth every other day.   Marland Kitchen atorvastatin (LIPITOR) 20 MG tablet Take 20 mg by mouth daily.   . citalopram (CELEXA) 20 MG tablet Take 20-40 mg by mouth at bedtime as needed (Sleep).   Marland Kitchen diltiazem (CARDIZEM SR) 120 MG 12 hr capsule Take 120 mg by mouth daily.   . hyoscyamine (ANASPAZ) 0.125 MG TBDP disintergrating tablet Place 0.125 mg under the tongue every 6 (six) hours as needed for cramping.   Marland Kitchen ibuprofen (ADVIL) 200 MG tablet Take 200 mg by mouth every 6 (six) hours as needed for moderate pain (pain score 4-6) or mild pain (pain score 1-3).   . isosorbide mononitrate (IMDUR) 60 MG 24 hr tablet Take 60 mg by mouth 2 (two) times daily.   Marland Kitchen ketoconazole (NIZORAL) 2 % cream Apply 1 Application topically daily as needed for irritation (Psoriasis).   Marland Kitchen levothyroxine (SYNTHROID) 112 MCG tablet Take 112 mcg by mouth daily. 04/10/2014: .  . magnesium gluconate (MAGONATE) 500 MG tablet Take 500 mg by mouth once a week.   . montelukast (SINGULAIR) 10 MG tablet Take 10 mg by mouth at  bedtime.   . nebivolol (BYSTOLIC) 5 MG tablet Take 2.5 mg by mouth in the morning and at bedtime.   Marland Kitchen OVER THE COUNTER MEDICATION Take 1 tablet by mouth once a week. Omega xl   . Probiotic Product (PROBIOTIC PO) Take 2 capsules by mouth daily as needed (Constipation). Emma   . sodium chloride (OCEAN) 0.65 % SOLN nasal spray Place 1-2 sprays into both nostrils as needed for congestion.   . sulconazole nitrate 1 % external solution Apply 1 application  topically daily as needed (Psoriasis).   . [DISCONTINUED] amLODipine (NORVASC) 5 MG tablet Take 5 mg by mouth every 12 (twelve) hours.    No facility-administered encounter medications on file as of 07/21/2023.     ONCOLOGIC FAMILY HISTORY:  Family History  Problem Relation Age of Onset  . Heart disease Father      SOCIAL HISTORY:  Social History  Socioeconomic History  . Marital status: Single    Spouse name: Not on file  . Number of children: Not on file  . Years of education: Not on file  . Highest education level: Not on file  Occupational History  . Occupation: Retired  Tobacco Use  . Smoking status: Former    Types: Cigarettes  . Smokeless tobacco: Never  Vaping Use  . Vaping status: Never Used  Substance and Sexual Activity  . Alcohol use: No    Alcohol/week: 0.0 standard drinks of alcohol  . Drug use: Never  . Sexual activity: Not Currently  Other Topics Concern  . Not on file  Social History Narrative  . Not on file   Social Drivers of Health   Financial Resource Strain: Low Risk  (06/11/2023)   Received from [facility]   Overall Financial Resource Strain (CARDIA)   . Difficulty of Paying Living Expenses: Not hard at all  Food Insecurity: No Food Insecurity (06/11/2023)   Received from [facility]   Hunger Vital Sign   . Worried About Programme researcher, broadcasting/film/video in the Last Year: Never true   . Ran Out of Food in the Last Year: Never true  Transportation Needs: No Transportation Needs (06/11/2023)   Received  from [facility] - Transportation   . Lack of Transportation (Medical): No   . Lack of Transportation (Non-Medical): No  Physical Activity: Insufficiently Active (02/10/2023)   [facility]   Exercise Vital Sign   . Days of Exercise per Week: 4 days   . Minutes of Exercise per Session: 30 min  Stress: Stress Concern Present (02/10/2023)   Received from [facility] - Occupational Stress Questionnaire   . Feeling of Stress : Very much  Social Connections: Socially Isolated (02/10/2023)   Received from [facility]   Social Network   . How would you rate your social network (family, work, friends)?: Little participation, lonely and socially isolated  Intimate Partner Violence: Not At Risk (03/04/2023)   Humiliation, Afraid, Rape, and Kick questionnaire   . Fear of Current or Ex-Partner: No   . Emotionally Abused: No   . Physically Abused: No   . Sexually Abused: No     OBSERVATIONS/OBJECTIVE:  BP (!) 152/63 (BP Location: Left Arm, Patient Position: Sitting)   Pulse 66   Temp 98.2 F (36.8 C) (Temporal)   Resp 19   Ht 5\' 7"  (1.702 m)   Wt 245 lb 3.2 oz (111.2 kg)   SpO2 100%   BMI 38.40 kg/m  GENERAL: Patient is a well appearing female in no acute distress HEENT:  Sclerae anicteric.  Oropharynx clear and moist. No ulcerations or evidence of oropharyngeal candidiasis. Neck is supple.  NODES:  No cervical, supraclavicular, or axillary lymphadenopathy palpated.  BREAST EXAM:  Deferred. LUNGS:  Clear to auscultation bilaterally.  No wheezes or rhonchi. HEART:  Regular rate and rhythm. No murmur appreciated. ABDOMEN:  Soft, nontender.  Positive, normoactive bowel sounds. No organomegaly palpated. MSK:  No focal spinal tenderness to palpation. Full range of motion bilaterally in the upper extremities. EXTREMITIES:  No peripheral edema.   SKIN:  Clear with no obvious rashes or skin changes. No nail  dyscrasia. NEURO:  Nonfocal. Well oriented.  Appropriate affect.   LABORATORY DATA:  None for this visit.  DIAGNOSTIC IMAGING:  None for this visit.      ASSESSMENT AND PLAN:  Ms.. Marquez is a  pleasant 78 y.o. female with Stage IA left breast invasive ductal carcinoma, ER+/PR+/HER2-, diagnosed in 12/2022, treated with lumpectomy, adjuvant radiation therapy, and anti-estrogen therapy with Anastrozole beginning in 03/2023.  She presents to the Survivorship Clinic for our initial meeting and routine follow-up post-completion of treatment for breast cancer.    1. Stage IA left breast cancer:  Ms. Dame is continuing to recover from definitive treatment for breast cancer. She will follow-up with her medical oncologist, Dr.  Pamelia Hoit in 6 months with history and physical exam per surveillance protocol.  She will continue her anti-estrogen therapy with Anastrozole. Thus far, she is tolerating the Anastrozole well, with minimal side effects. Her mammogram is due 11/2023; orders placed today.   Today, a comprehensive survivorship care plan and treatment summary was reviewed with the patient today detailing her breast cancer diagnosis, treatment course, potential late/long-term effects of treatment, appropriate follow-up care with recommendations for the future, and patient education resources.  A copy of this summary, along with a letter will be sent to the patient's primary care provider via mail/fax/In Basket message after today's visit.    #. Problem(s) at Visit______________  #. Bone health:  Given Ms. Tumblin's age/history of breast cancer and her current treatment regimen including anti-estrogen therapy with Anastrozole, she is at risk for bone demineralization.  Her last DEXA scan was 06/04/2023 demonstrating osteopenia with a t score of -1.8 in the right femur.  Repeat is recommended in 05/2025.  She was given education on specific activities to promote bone health.  #. Cancer screening:  Due to Ms.  Messamore's history and her age, she should receive screening for skin cancers, colon cancer, and gynecologic cancers.  The information and recommendations are listed on the patient's comprehensive care plan/treatment summary and were reviewed in detail with the patient.    #. Health maintenance and wellness promotion: Ms. Nucci was encouraged to consume 5-7 servings of fruits and vegetables per day. We reviewed the "Nutrition Rainbow" handout.  She was also encouraged to engage in moderate to vigorous exercise for 30 minutes per day most days of the week.  She was instructed to limit her alcohol consumption and continue to abstain from tobacco use/***was encouraged stop smoking.     #. Support services/counseling: It is not uncommon for this period of the patient's cancer care trajectory to be one of many emotions and stressors.   She was given information regarding our available services and encouraged to contact me with any questions or for help enrolling in any of our support group/programs.    Follow up instructions:    -Return to cancer center ***  -Mammogram due in *** -She is welcome to return back to the Survivorship Clinic at any time; no additional follow-up needed at this time.  -Consider referral back to survivorship as a long-term survivor for continued surveillance  The patient was provided an opportunity to ask questions and all were answered. The patient agreed with the plan and demonstrated an understanding of the instructions.   Total encounter time:*** minutes*in face-to-face visit time, chart review, lab review, care coordination, order entry, and documentation of the encounter time.    Lillard Anes, NP 07/21/23 1:12 PM Medical Oncology and Hematology [facility] [address] [place], [place] [address] Tel. [phone]    Fax. [phone]  *Total Encounter Time as defined by the Centers for Medicare and Medicaid Services includes, in addition to the  face-to-face time of a patient visit (documented in the note above)  non-face-to-face time: obtaining and reviewing outside history, ordering and reviewing medications, tests or procedures, care coordination (communications with other health care professionals or caregivers) and documentation in the medical record.

## 2023-07-22 ENCOUNTER — Telehealth: Payer: Self-pay | Admitting: Hematology and Oncology

## 2023-07-22 NOTE — Telephone Encounter (Signed)
 Scheduled appointment per 2/24 los. Left VM with appointment details.

## 2023-07-28 ENCOUNTER — Telehealth: Payer: Self-pay

## 2023-07-28 NOTE — Telephone Encounter (Signed)
 Pt called and c/o slight blurry vision, states it has mostly resolved today. She has a hx of head aches, HPB, reports her BP has been staying in the 140/80 range and is concerned about this. Pt was educated on stroke precautions and she verbalized understanding. She was advised to call her eye dr for an eye exam. Pt also complains of insomnia and was advised to try Melatonin. She agrees if sx persist with blurry vision since stating letrozole, she will call us back.

## 2023-07-29 ENCOUNTER — Telehealth: Payer: Self-pay | Admitting: *Deleted

## 2023-07-29 NOTE — Telephone Encounter (Signed)
 Received call from pt with complaint of chest tightness and hear palpitations x1 day.  Pt states she believes symptoms are related to Letrozole. Pt states she has also contacted her cardiologist and waiting for a call back from their office.  Per MD, pt needing to hold letrozole x 2 weeks and f/u in office.  Pt educated to go to ED if chest pain continues and to f/u with cardiologist.  Pt verbalized understanding.

## 2023-08-11 ENCOUNTER — Telehealth: Payer: Self-pay | Admitting: Hematology and Oncology

## 2023-08-11 ENCOUNTER — Inpatient Hospital Stay: Attending: Adult Health | Admitting: Hematology and Oncology

## 2023-08-11 VITALS — BP 134/65 | HR 71 | Temp 98.1°F | Resp 18 | Ht 67.0 in | Wt 248.6 lb

## 2023-08-11 DIAGNOSIS — Z79811 Long term (current) use of aromatase inhibitors: Secondary | ICD-10-CM | POA: Insufficient documentation

## 2023-08-11 DIAGNOSIS — I10 Essential (primary) hypertension: Secondary | ICD-10-CM | POA: Insufficient documentation

## 2023-08-11 DIAGNOSIS — R002 Palpitations: Secondary | ICD-10-CM | POA: Insufficient documentation

## 2023-08-11 DIAGNOSIS — Z923 Personal history of irradiation: Secondary | ICD-10-CM | POA: Diagnosis not present

## 2023-08-11 DIAGNOSIS — Z170 Estrogen receptor positive status [ER+]: Secondary | ICD-10-CM | POA: Diagnosis not present

## 2023-08-11 DIAGNOSIS — Z1721 Progesterone receptor positive status: Secondary | ICD-10-CM | POA: Diagnosis not present

## 2023-08-11 DIAGNOSIS — Z1732 Human epidermal growth factor receptor 2 negative status: Secondary | ICD-10-CM | POA: Diagnosis not present

## 2023-08-11 DIAGNOSIS — C50412 Malignant neoplasm of upper-outer quadrant of left female breast: Secondary | ICD-10-CM | POA: Insufficient documentation

## 2023-08-11 MED ORDER — EXEMESTANE 25 MG PO TABS: 25.0000 mg | ORAL_TABLET | Freq: Every day | ORAL | 1 refills | Status: DC

## 2023-08-11 NOTE — Assessment & Plan Note (Signed)
 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, margins negative, LVI not identified, ER 90%, PR 70%, HER2 negative by FISH, Ki-67 1%  Oncotype score of 27 (16% risk of recurrence)   Treatment plan: I did not recommend adjuvant chemotherapy because of her age, performance status, mental state as I have a strong suspicion that she would not be able to handle chemotherapy due to mental health concerns and lack of support Adjuvant radiation therapy to be completed 04/02/2023 Followed by adjuvant antiestrogen therapy with anastrozole 1 mg daily started 04/16/2023 ---------------------------------------------------------------------------------------------------------------- Anastrozole toxicities: Palpitations: Held anastrozole for 2 weeks Weight issues: Patient is contemplating on Ozempic  Return to clinic in 1 year for follow-up

## 2023-08-11 NOTE — Telephone Encounter (Signed)
 Called to get the patient scheduled per 3/17 los. Called x2; however, the call could not be completed at this time. Called the patients niece but that number is not in service at this time.

## 2023-08-11 NOTE — Progress Notes (Signed)
 Patient Care Team: Henrine Screws, MD as PCP - General (Family Medicine) Abigail Miyamoto, MD as Consulting Physician (General Surgery) Serena Croissant, MD as Consulting Physician (Hematology and Oncology) Dorothy Puffer, MD as Consulting Physician (Radiation Oncology)  DIAGNOSIS:  Encounter Diagnosis  Name Primary?   Malignant neoplasm of upper-outer quadrant of left breast in female, estrogen receptor positive (HCC) Yes    SUMMARY OF ONCOLOGIC HISTORY: Oncology History  Malignant neoplasm of upper-outer quadrant of left breast in female, estrogen receptor positive (HCC)  01/07/2023 Initial Diagnosis   Screening mammogram detected left breast mass (0.9 cm) with distortion and calcifications spanning 1.8 cm posterior depth, axilla negative; biopsy of the mass: Grade 2 IDC ER 90%, PR 70%, HER2 2+ by IHC, FISH negative, Ki-67 1%   01/13/2023 Cancer Staging   Staging form: Breast, AJCC 8th Edition - Clinical: Stage IA (cT1b, cN0, cM0, G2, ER+, PR+, HER2-) - Signed by Serena Croissant, MD on 01/13/2023 Stage prefix: Initial diagnosis Histologic grading system: 3 grade system   01/23/2023 Surgery   Left lumpectomy: Grade 2 IDC 1.4 cm with grade 1-2 DCIS, margins negative, LVI not identified, ER 90%, PR 70%, HER2 negative by FISH, Ki-67 1%    02/19/2023 Oncotype testing   Oncotype score of 27 (16% risk of recurrence)   03/25/2023 - 04/22/2023 Radiation Therapy   Plan Name: Breast_L Site: Breast, Left Technique: 3D Mode: Photon Dose Per Fraction: 2.66 Gy Prescribed Dose (Delivered / Prescribed): 42.56 Gy / 42.56 Gy Prescribed Fxs (Delivered / Prescribed): 16 / 16   Plan Name: Breast_L_Bst Site: Breast, Left Technique: 3D Mode: Photon Dose Per Fraction: 2 Gy Prescribed Dose (Delivered / Prescribed): 8 Gy / 8 Gy Prescribed Fxs (Delivered / Prescribed): 4 / 4   03/2023 -  Anti-estrogen oral therapy   Anastrozole x 5 years     CHIEF COMPLIANT:   HISTORY OF PRESENT  ILLNESS: Discussed the use of AI scribe software for clinical note transcription with the patient, who gave verbal consent to proceed.  History of Present Illness The patient, with a history of hypertension and a heart condition, presents with palpitations, particularly at night, and associated sweats. She describes the palpitations as her heart 'skipping.' These symptoms were significant with the first medication and persisted with the second medication. The patient stopped the medication and the symptoms improved. She has been under the care of a cardiologist, who has conducted a thirty-day monitor and found a couple of episodes of irregular heart rhythm. The cardiologist suggested that the second medication for the heart condition was better than the first one, Anastazol, and recommended Lextrozole if there were no other options. The patient also has a blood pressure issue and has been diagnosed with electrocardiology. She reports that her heart conditions are more toward the electrocardiologist, but as far as blockages, she is okay.     ALLERGIES:  is allergic to doxycycline, gluten meal, and lisinopril.  MEDICATIONS:  Current Outpatient Medications  Medication Sig Dispense Refill   acetaminophen (TYLENOL) 500 MG tablet Take 1,000-1,500 mg by mouth every 6 (six) hours as needed for moderate pain (pain score 4-6) or mild pain (pain score 1-3).     ALPRAZolam (XANAX) 0.5 MG tablet Take 0.5 mg by mouth 2 (two) times daily as needed for anxiety.     ASPIRIN LOW DOSE 81 MG tablet Take 81 mg by mouth every other day.     atorvastatin (LIPITOR) 20 MG tablet Take 20 mg by mouth daily.  citalopram (CELEXA) 20 MG tablet Take 20-40 mg by mouth at bedtime as needed (Sleep).     diltiazem (CARDIZEM SR) 120 MG 12 hr capsule Take 120 mg by mouth daily.     hyoscyamine (ANASPAZ) 0.125 MG TBDP disintergrating tablet Place 0.125 mg under the tongue every 6 (six) hours as needed for cramping.     ibuprofen  (ADVIL) 200 MG tablet Take 200 mg by mouth every 6 (six) hours as needed for moderate pain (pain score 4-6) or mild pain (pain score 1-3).     isosorbide mononitrate (IMDUR) 60 MG 24 hr tablet Take 60 mg by mouth 2 (two) times daily.     ketoconazole (NIZORAL) 2 % cream Apply 1 Application topically daily as needed for irritation (Psoriasis).     letrozole (FEMARA) 2.5 MG tablet Take 1 tablet (2.5 mg total) by mouth daily. 90 tablet 3   levothyroxine (SYNTHROID) 112 MCG tablet Take 112 mcg by mouth daily.     losartan (COZAAR) 50 MG tablet Take 50 mg by mouth daily.     magnesium gluconate (MAGONATE) 500 MG tablet Take 500 mg by mouth once a week.     montelukast (SINGULAIR) 10 MG tablet Take 10 mg by mouth at bedtime.     nebivolol (BYSTOLIC) 5 MG tablet Take 2.5 mg by mouth in the morning and at bedtime.     OVER THE COUNTER MEDICATION Take 1 tablet by mouth once a week. Omega xl     Probiotic Product (PROBIOTIC PO) Take 2 capsules by mouth daily as needed (Constipation). Emma     sodium chloride (OCEAN) 0.65 % SOLN nasal spray Place 1-2 sprays into both nostrils as needed for congestion.     sulconazole nitrate 1 % external solution Apply 1 application  topically daily as needed (Psoriasis).     No current facility-administered medications for this visit.    PHYSICAL EXAMINATION: ECOG PERFORMANCE STATUS: 1 - Symptomatic but completely ambulatory  There were no vitals filed for this visit. There were no vitals filed for this visit.  Physical Exam   (exam performed in the presence of a chaperone)  LABORATORY DATA:  I have reviewed the data as listed    Latest Ref Rng & Units 01/13/2023   10:49 AM 08/29/2020    5:16 AM 12/21/2019   12:25 PM  CMP  Glucose 70 - 99 mg/dL 161  096  045   BUN 8 - 23 mg/dL 17  9  6    Creatinine 0.44 - 1.00 mg/dL 4.09  8.11  9.14   Sodium 135 - 145 mmol/L 139  139  139   Potassium 3.5 - 5.1 mmol/L 4.0  3.8  4.3   Chloride 98 - 111 mmol/L 106  104  104    CO2 22 - 32 mmol/L 27  24  27    Calcium 8.9 - 10.3 mg/dL 9.6  9.3  9.2   Total Protein 6.5 - 8.1 g/dL 7.8  7.5    Total Bilirubin 0.3 - 1.2 mg/dL 0.5  0.4    Alkaline Phos 38 - 126 U/L 85  80    AST 15 - 41 U/L 20  17    ALT 0 - 44 U/L 18  12      Lab Results  Component Value Date   WBC 9.0 01/13/2023   HGB 12.9 01/13/2023   HCT 39.5 01/13/2023   MCV 97.5 01/13/2023   PLT 297 01/13/2023   NEUTROABS 4.7 01/13/2023  ASSESSMENT & PLAN:  Malignant neoplasm of upper-outer quadrant of left breast in female, estrogen receptor positive (HCC) 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, margins negative, LVI not identified, ER 90%, PR 70%, HER2 negative by FISH, Ki-67 1%  Oncotype score of 27 (16% risk of recurrence)   Treatment plan: I did not recommend adjuvant chemotherapy because of her age, performance status, mental state as I have a strong suspicion that she would not be able to handle chemotherapy due to mental health concerns and lack of support Adjuvant radiation therapy to be completed 04/02/2023 Followed by adjuvant antiestrogen therapy with letrozole 1 mg daily started 04/16/2023 (could not tolerate anastrozole because of the same issue of palpitations) ---------------------------------------------------------------------------------------------------------------- Anastrozole toxicities: Palpitations: Held Letrozole for 2 weeks, starting on exemestane 08/11/2023 Weight issues: Patient is contemplating on Ozempic  Telephone visit in 1 month to assess tolerance to anastrozole ------------------------------------- Assessment and Plan Assessment & Plan Malignant neoplasm of upper-outer quadrant of left breast, estrogen receptor positive Palpitations potentially related to aromatase inhibitors. Anastrozole and letrozole caused significant palpitations. Willing to try exemestane, insurance coverage to be verified. - Prescribe exemestane and verify  insurance coverage. - Send a 30-day supply of exemestane to [facility] in [place]. - Follow up in one month to assess tolerance and effectiveness of exemestane.  Palpitations Significant palpitations with certain medications. Cardiologist identified electrical heart condition. Palpitations improved after stopping previous medications. - Monitor for palpitations with exemestane. - Coordinate care with the cardiologist to manage palpitations and related heart conditions.  Hypertension Hypertension under cardiologist care. Blood pressure management ongoing. - Continue current hypertension management under cardiologist guidance.      No orders of the defined types were placed in this encounter.  The patient has a good understanding of the overall plan. she agrees with it. she will call with any problems that may develop before the next visit here. Total time spent: 30 mins including face to face time and time spent for planning, charting and co-ordination of care   Tamsen Meek, MD 08/11/23

## 2023-08-22 DIAGNOSIS — M25562 Pain in left knee: Secondary | ICD-10-CM | POA: Diagnosis not present

## 2023-08-22 DIAGNOSIS — F411 Generalized anxiety disorder: Secondary | ICD-10-CM | POA: Diagnosis not present

## 2023-08-22 DIAGNOSIS — I10 Essential (primary) hypertension: Secondary | ICD-10-CM | POA: Diagnosis not present

## 2023-08-26 IMAGING — MG MM DIGITAL SCREENING BILAT W/ TOMO AND CAD
8 series · 8 of 24 positions shown · non-contrast
Comparison: Previous exam(s).

CLINICAL DATA: Screening.

EXAM:
DIGITAL SCREENING BILATERAL MAMMOGRAM WITH TOMOSYNTHESIS AND CAD
TECHNIQUE: Bilateral screening digital craniocaudal and mediolateral oblique
mammograms were obtained. Bilateral screening digital breast
tomosynthesis was performed. The images were evaluated with
computer-aided detection.

[R MLO synth-2D]
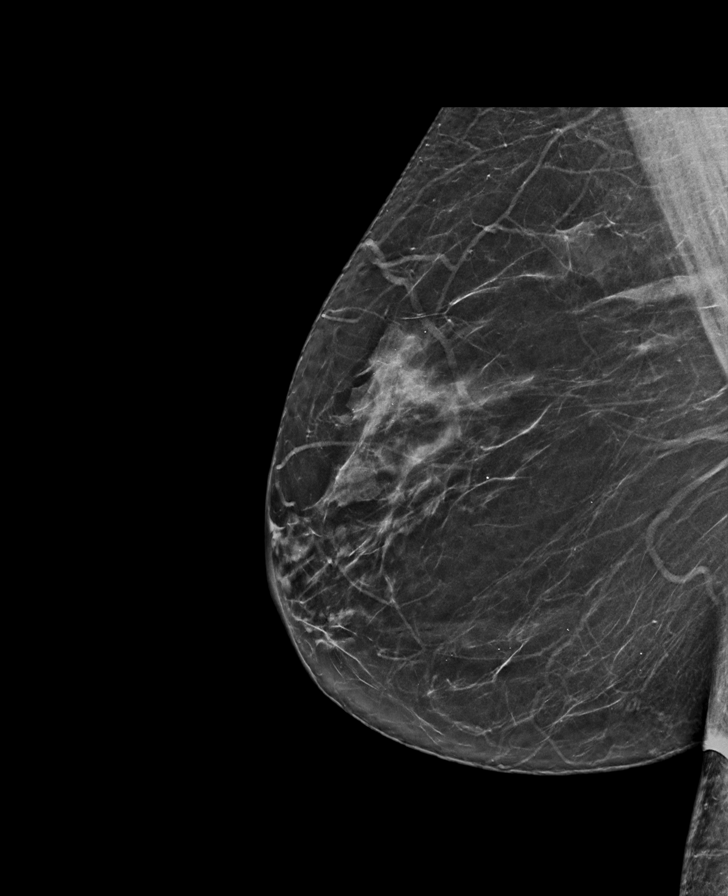

[R CC synth-2D]
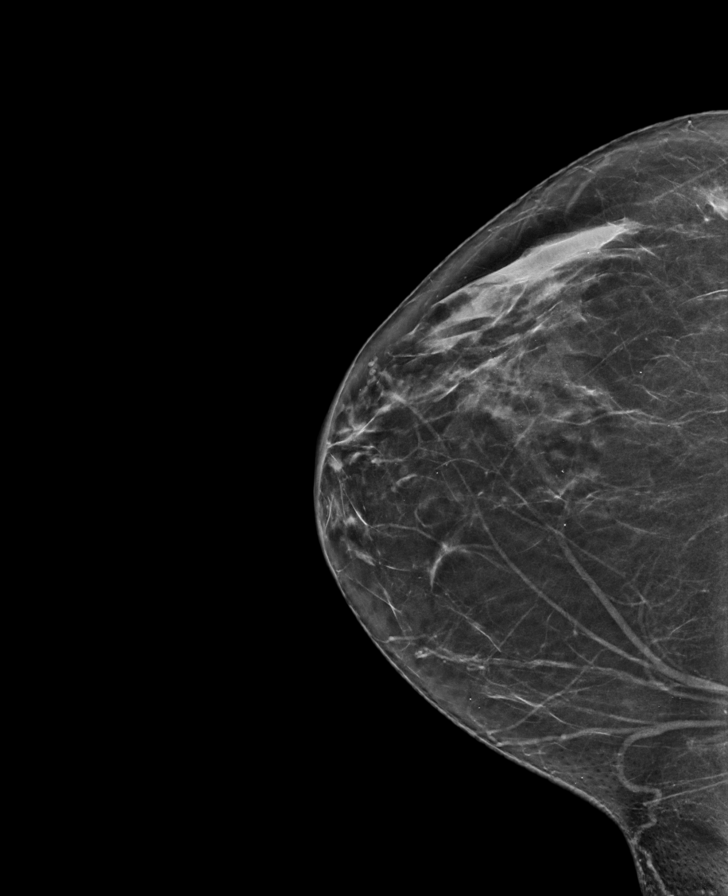

[L CC synth-2D]
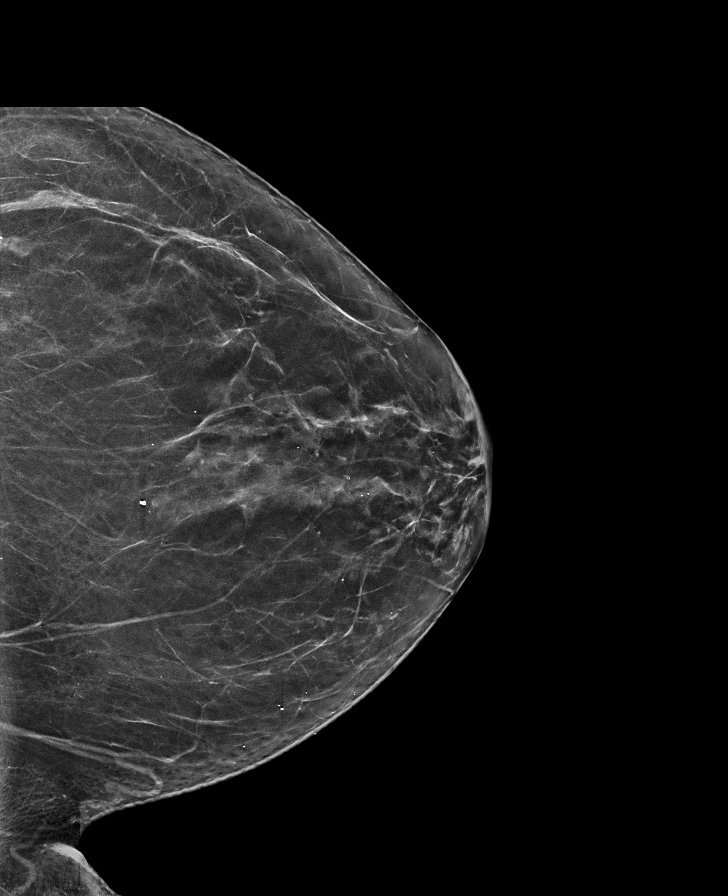

[L MLO synth-2D]
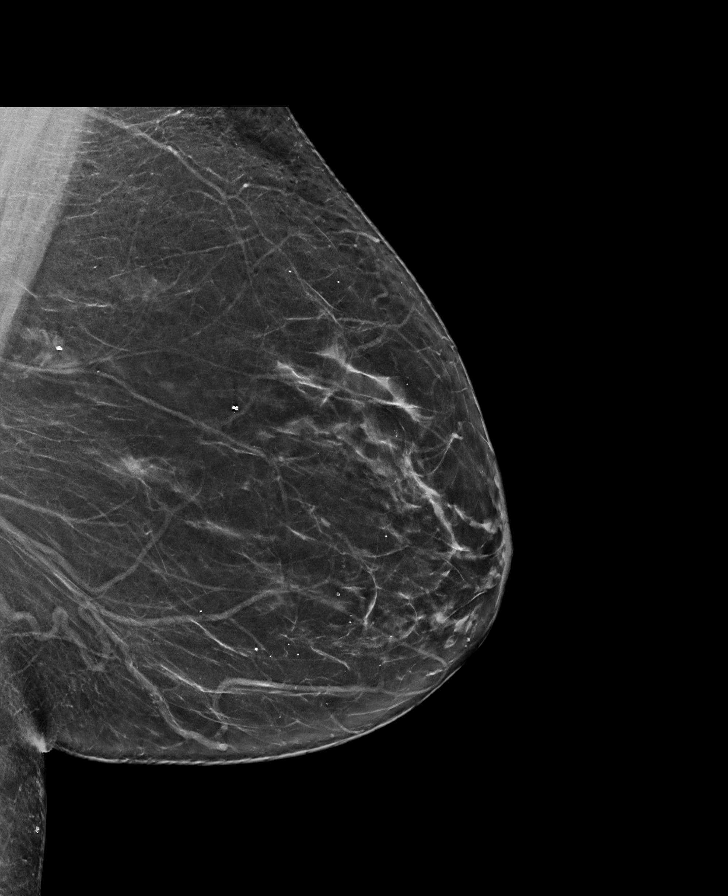

[L CC tomo · tomo slice 34/67.0]
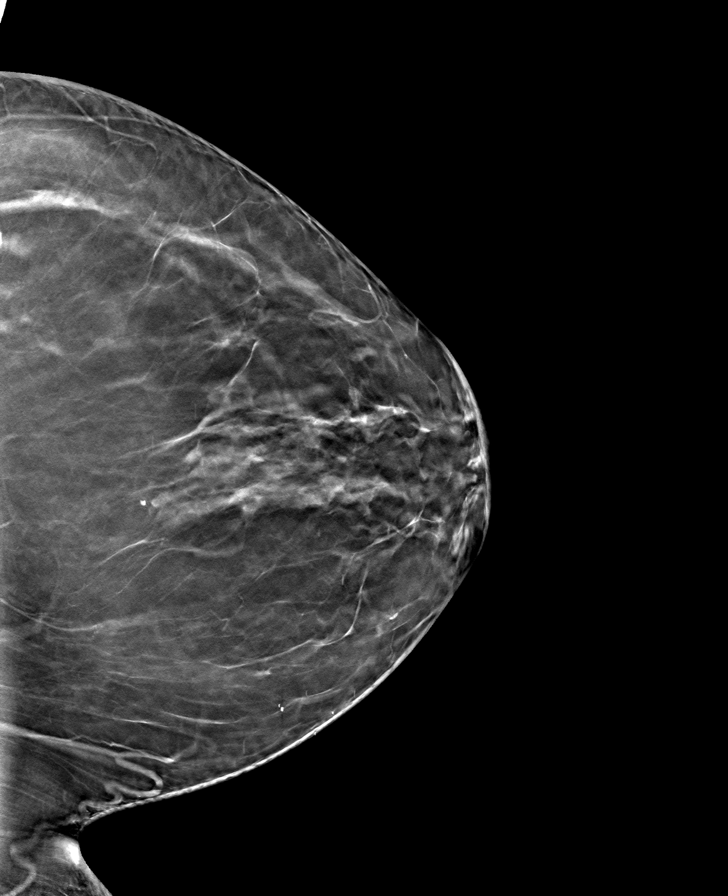

[R CC tomo · tomo slice 33/65.0]
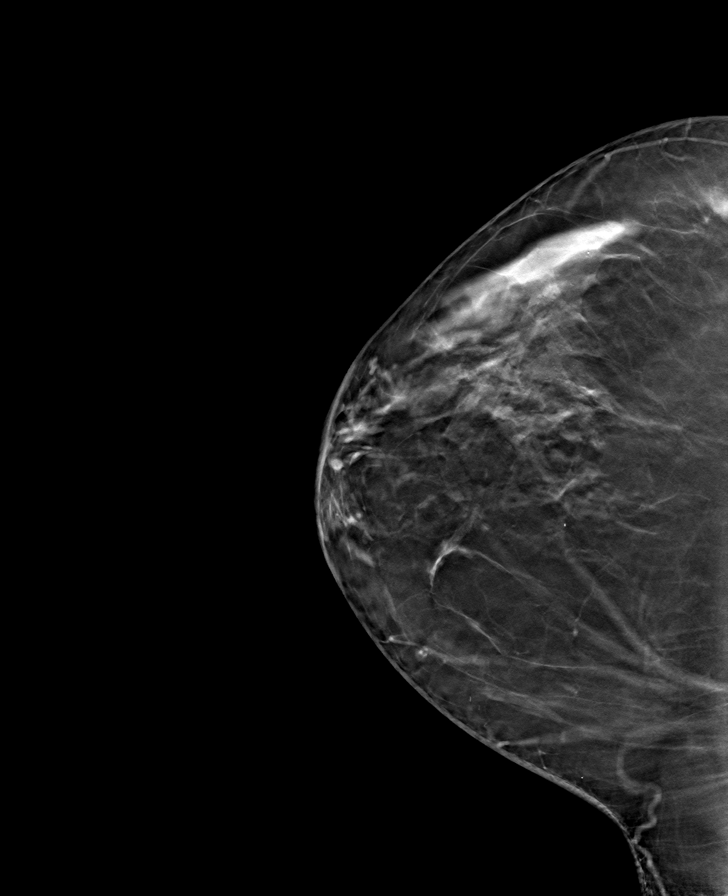

[L MLO tomo · tomo slice 37/73.0]
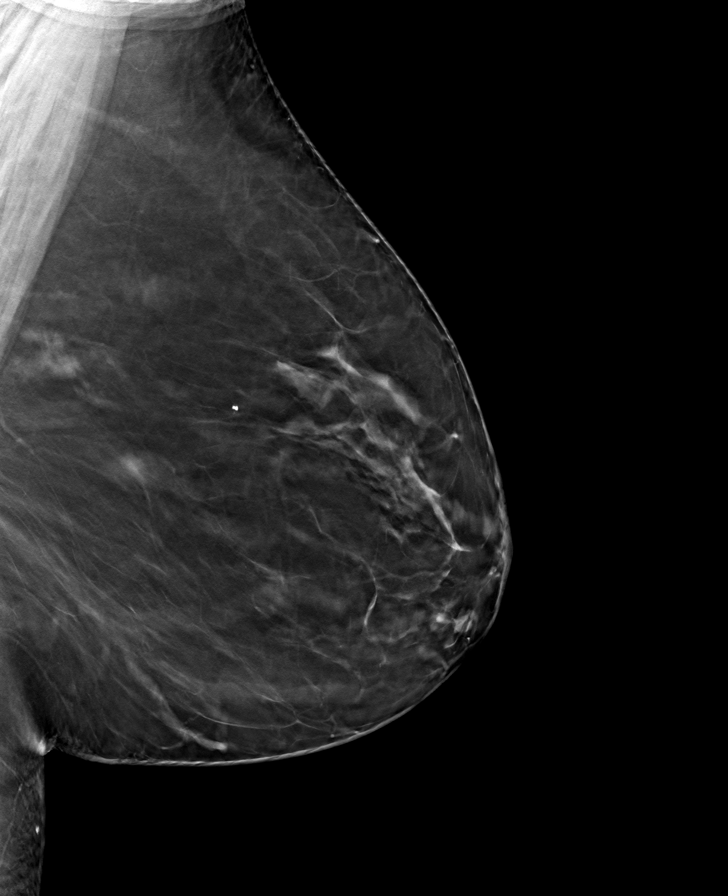

[R MLO tomo · tomo slice 33/66.0]
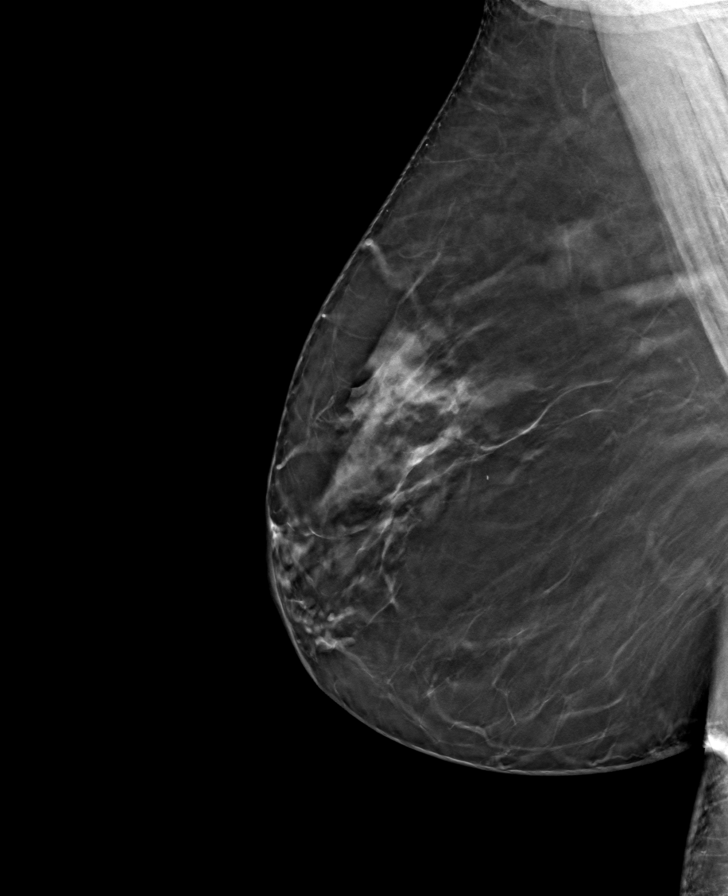

[8 of 24 positions shown; findings below may reference images not displayed]

ACR Breast Density Category b: There are scattered areas of
fibroglandular density.
FINDINGS: There are no findings suspicious for malignancy.
IMPRESSION: No mammographic evidence of malignancy. A result letter of this
screening mammogram will be mailed directly to the patient.

RECOMMENDATION:
Screening mammogram in one year. (Code:51-O-LD2)

BI-RADS CATEGORY  1: Negative.

## 2023-08-28 ENCOUNTER — Other Ambulatory Visit (HOSPITAL_COMMUNITY): Payer: Self-pay | Admitting: Family Medicine

## 2023-08-28 DIAGNOSIS — R52 Pain, unspecified: Secondary | ICD-10-CM

## 2023-09-02 ENCOUNTER — Ambulatory Visit (HOSPITAL_COMMUNITY)
Admission: RE | Admit: 2023-09-02 | Discharge: 2023-09-02 | Disposition: A | Discharge status: Home / Self Care | Source: Ambulatory Visit | Attending: Family Medicine | Admitting: Family Medicine

## 2023-09-02 DIAGNOSIS — M1712 Unilateral primary osteoarthritis, left knee: Secondary | ICD-10-CM | POA: Diagnosis not present

## 2023-09-02 DIAGNOSIS — R52 Pain, unspecified: Secondary | ICD-10-CM | POA: Insufficient documentation

## 2023-09-02 DIAGNOSIS — M25562 Pain in left knee: Secondary | ICD-10-CM | POA: Diagnosis not present

## 2023-09-09 ENCOUNTER — Inpatient Hospital Stay: Attending: Adult Health | Admitting: Hematology and Oncology

## 2023-09-09 DIAGNOSIS — C50412 Malignant neoplasm of upper-outer quadrant of left female breast: Secondary | ICD-10-CM | POA: Diagnosis not present

## 2023-09-09 DIAGNOSIS — Z1732 Human epidermal growth factor receptor 2 negative status: Secondary | ICD-10-CM | POA: Diagnosis not present

## 2023-09-09 DIAGNOSIS — Z923 Personal history of irradiation: Secondary | ICD-10-CM | POA: Diagnosis not present

## 2023-09-09 DIAGNOSIS — Z170 Estrogen receptor positive status [ER+]: Secondary | ICD-10-CM | POA: Diagnosis not present

## 2023-09-09 DIAGNOSIS — Z1721 Progesterone receptor positive status: Secondary | ICD-10-CM

## 2023-09-09 DIAGNOSIS — Z79811 Long term (current) use of aromatase inhibitors: Secondary | ICD-10-CM | POA: Diagnosis not present

## 2023-09-09 NOTE — Assessment & Plan Note (Addendum)
 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, margins negative, LVI not identified, ER 90%, PR 70%, HER2 negative by FISH, Ki-67 1%  Oncotype score of 27 (16% risk of recurrence)   Treatment plan: I did not recommend adjuvant chemotherapy because of her age, performance status, mental state as I have a strong suspicion that she would not be able to handle chemotherapy due to mental health concerns and lack of support Adjuvant radiation therapy to be completed 04/02/2023 Followed by adjuvant antiestrogen therapy with letrozole 1 mg daily started 04/16/2023 (could not tolerate anastrozole because of the same issue of palpitations) switched to exemestane on 08/11/2023 for palpitations ---------------------------------------------------------------------------------------------------------------- Exemestane toxicities: Palpitations:   Weight issues: Patient is contemplating on Ozempic   Telephone visit in 1 month to assess tolerance to exemestane

## 2023-09-09 NOTE — Progress Notes (Signed)
 HEMATOLOGY-ONCOLOGY TELEPHONE VISIT PROGRESS NOTE  I connected with our patient on 09/09/23 at  3:00 PM EDT by telephone and verified that I am speaking with the correct person using two identifiers.  I discussed the limitations, risks, security and privacy concerns of performing an evaluation and management service by telephone and the availability of in person appointments.  I also discussed with the patient that there may be a patient responsible charge related to this service. The patient expressed understanding and agreed to proceed.   History of Present Illness: Follow-up to evaluate tolerance to exemestane  History of Present Illness Ms. Melton 78 year old who could not tolerate antiestrogen therapy with letrozole and anastrozole.  She was started on exemestane and connected with me by telephone to discuss tolerance to the treatment.  She is tolerating it extremely well.  Does not have any palpitations.  She is continues to have occasional hot flashes.    Oncology History  Malignant neoplasm of upper-outer quadrant of left breast in female, estrogen receptor positive (HCC)  01/07/2023 Initial Diagnosis   Screening mammogram detected left breast mass (0.9 cm) with distortion and calcifications spanning 1.8 cm posterior depth, axilla negative; biopsy of the mass: Grade 2 IDC ER 90%, PR 70%, HER2 2+ by IHC, FISH negative, Ki-67 1%   01/13/2023 Cancer Staging   Staging form: Breast, AJCC 8th Edition - Clinical: Stage IA (cT1b, cN0, cM0, G2, ER+, PR+, HER2-) - Signed by Serena Croissant, MD on 01/13/2023 Stage prefix: Initial diagnosis Histologic grading system: 3 grade system   01/23/2023 Surgery   Left lumpectomy: Grade 2 IDC 1.4 cm with grade 1-2 DCIS, margins negative, LVI not identified, ER 90%, PR 70%, HER2 negative by FISH, Ki-67 1%    02/19/2023 Oncotype testing   Oncotype score of 27 (16% risk of recurrence)   03/25/2023 - 04/22/2023 Radiation Therapy   Plan Name: Breast_L Site:  Breast, Left Technique: 3D Mode: Photon Dose Per Fraction: 2.66 Gy Prescribed Dose (Delivered / Prescribed): 42.56 Gy / 42.56 Gy Prescribed Fxs (Delivered / Prescribed): 16 / 16   Plan Name: Breast_L_Bst Site: Breast, Left Technique: 3D Mode: Photon Dose Per Fraction: 2 Gy Prescribed Dose (Delivered / Prescribed): 8 Gy / 8 Gy Prescribed Fxs (Delivered / Prescribed): 4 / 4   03/2023 -  Anti-estrogen oral therapy   Anastrozole x 5 years     REVIEW OF SYSTEMS:   Constitutional: Denies fevers, chills or abnormal weight loss All other systems were reviewed with the patient and are negative. Observations/Objective:     Assessment Plan:  Malignant neoplasm of upper-outer quadrant of left breast in female, estrogen receptor positive (HCC) 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, margins negative, LVI not identified, ER 90%, PR 70%, HER2 negative by FISH, Ki-67 1%  Oncotype score of 27 (16% risk of recurrence)   Treatment plan: I did not recommend adjuvant chemotherapy because of her age, performance status, mental state as I have a strong suspicion that she would not be able to handle chemotherapy due to mental health concerns and lack of support Adjuvant radiation therapy to be completed 04/02/2023 Followed by adjuvant antiestrogen therapy with letrozole 1 mg daily started 04/16/2023 (could not tolerate anastrozole because of the same issue of palpitations) switched to exemestane on 08/11/2023 for palpitations ---------------------------------------------------------------------------------------------------------------- Exemestane toxicities: Palpitations: tolerating this well Occ Hot flashes Weight issues: Patient is contemplating on Ozempic   Follow back at her scheduled appointment in August    I discussed the  assessment and treatment plan with the patient. The patient was provided an opportunity to ask questions and all were answered. The patient  agreed with the plan and demonstrated an understanding of the instructions. The patient was advised to call back or seek an in-person evaluation if the symptoms worsen or if the condition fails to improve as anticipated.   I provided 20 minutes of non-face-to-face time during this encounter.  This includes time for charting and coordination of care   Margert Sheerer, MD

## 2023-09-10 ENCOUNTER — Other Ambulatory Visit: Payer: Self-pay | Admitting: Hematology and Oncology

## 2023-09-22 DIAGNOSIS — I10 Essential (primary) hypertension: Secondary | ICD-10-CM | POA: Diagnosis not present

## 2023-09-22 DIAGNOSIS — R42 Dizziness and giddiness: Secondary | ICD-10-CM | POA: Diagnosis not present

## 2023-09-22 DIAGNOSIS — R002 Palpitations: Secondary | ICD-10-CM | POA: Diagnosis not present

## 2023-09-22 DIAGNOSIS — I471 Supraventricular tachycardia, unspecified: Secondary | ICD-10-CM | POA: Diagnosis not present

## 2023-09-26 DIAGNOSIS — Z853 Personal history of malignant neoplasm of breast: Secondary | ICD-10-CM | POA: Diagnosis not present

## 2023-09-26 DIAGNOSIS — I10 Essential (primary) hypertension: Secondary | ICD-10-CM | POA: Diagnosis not present

## 2023-09-26 DIAGNOSIS — I471 Supraventricular tachycardia, unspecified: Secondary | ICD-10-CM | POA: Diagnosis not present

## 2023-09-26 DIAGNOSIS — R002 Palpitations: Secondary | ICD-10-CM | POA: Diagnosis not present

## 2023-10-03 DIAGNOSIS — I471 Supraventricular tachycardia, unspecified: Secondary | ICD-10-CM | POA: Diagnosis not present

## 2023-10-03 DIAGNOSIS — I10 Essential (primary) hypertension: Secondary | ICD-10-CM | POA: Diagnosis not present

## 2023-10-03 DIAGNOSIS — G4733 Obstructive sleep apnea (adult) (pediatric): Secondary | ICD-10-CM | POA: Diagnosis not present

## 2023-10-03 DIAGNOSIS — Z0000 Encounter for general adult medical examination without abnormal findings: Secondary | ICD-10-CM | POA: Diagnosis not present

## 2023-10-03 DIAGNOSIS — Z6841 Body Mass Index (BMI) 40.0 and over, adult: Secondary | ICD-10-CM | POA: Diagnosis not present

## 2023-10-03 DIAGNOSIS — F411 Generalized anxiety disorder: Secondary | ICD-10-CM | POA: Diagnosis not present

## 2023-10-15 DIAGNOSIS — E785 Hyperlipidemia, unspecified: Secondary | ICD-10-CM | POA: Diagnosis not present

## 2023-10-23 DIAGNOSIS — I10 Essential (primary) hypertension: Secondary | ICD-10-CM | POA: Diagnosis not present

## 2023-10-23 DIAGNOSIS — R002 Palpitations: Secondary | ICD-10-CM | POA: Diagnosis not present

## 2023-10-23 DIAGNOSIS — I471 Supraventricular tachycardia, unspecified: Secondary | ICD-10-CM | POA: Diagnosis not present

## 2023-11-03 DIAGNOSIS — I471 Supraventricular tachycardia, unspecified: Secondary | ICD-10-CM | POA: Diagnosis not present

## 2023-11-13 DIAGNOSIS — I471 Supraventricular tachycardia, unspecified: Secondary | ICD-10-CM | POA: Diagnosis not present

## 2023-11-19 ENCOUNTER — Telehealth: Payer: Self-pay | Admitting: *Deleted

## 2023-11-19 NOTE — Telephone Encounter (Signed)
 Pt called with c/o uncontrollable itching and believes that her antiestrogen is the cause. Pt stated that she was having it when she first started but it wasn't as severe but the past 2 weeks have been more extreme and causing her anxiety. Advise pt to stop medication and take benadryl as prescribed. Advised to f/u with NP in 2 weeks for f/u. Advised not to scratch due to risk of breaking skin and increasing risk of infection. Advised to use lotion with aloe in it to help with itching or an anti-itch cream. Pt verbalized understanding and appreciation.

## 2023-12-01 ENCOUNTER — Encounter: Payer: Self-pay | Admitting: Adult Health

## 2023-12-01 ENCOUNTER — Inpatient Hospital Stay: Attending: Adult Health | Admitting: Adult Health

## 2023-12-01 VITALS — BP 134/60 | HR 76 | Temp 98.3°F | Resp 17 | Wt 251.7 lb

## 2023-12-01 DIAGNOSIS — Z1732 Human epidermal growth factor receptor 2 negative status: Secondary | ICD-10-CM | POA: Diagnosis not present

## 2023-12-01 DIAGNOSIS — Z87891 Personal history of nicotine dependence: Secondary | ICD-10-CM | POA: Insufficient documentation

## 2023-12-01 DIAGNOSIS — Z79811 Long term (current) use of aromatase inhibitors: Secondary | ICD-10-CM | POA: Insufficient documentation

## 2023-12-01 DIAGNOSIS — Z170 Estrogen receptor positive status [ER+]: Secondary | ICD-10-CM | POA: Diagnosis not present

## 2023-12-01 DIAGNOSIS — Z1721 Progesterone receptor positive status: Secondary | ICD-10-CM | POA: Insufficient documentation

## 2023-12-01 DIAGNOSIS — Z923 Personal history of irradiation: Secondary | ICD-10-CM | POA: Insufficient documentation

## 2023-12-01 DIAGNOSIS — C50412 Malignant neoplasm of upper-outer quadrant of left female breast: Secondary | ICD-10-CM | POA: Insufficient documentation

## 2023-12-01 DIAGNOSIS — Z90710 Acquired absence of both cervix and uterus: Secondary | ICD-10-CM | POA: Insufficient documentation

## 2023-12-01 DIAGNOSIS — R002 Palpitations: Secondary | ICD-10-CM | POA: Diagnosis not present

## 2023-12-01 DIAGNOSIS — I10 Essential (primary) hypertension: Secondary | ICD-10-CM | POA: Insufficient documentation

## 2023-12-01 MED ORDER — LETROZOLE 2.5 MG PO TABS: 2.5000 mg | ORAL_TABLET | Freq: Every day | ORAL | 1 refills | Status: DC

## 2023-12-01 NOTE — Progress Notes (Signed)
 [facility] Cancer Follow up:    Ruth Charleston, MD [address] [place] [address]   DIAGNOSIS:  Cancer Staging  Malignant neoplasm of upper-outer quadrant of left breast in female, estrogen receptor positive (HCC) Staging form: Breast, AJCC 8th Edition - Clinical: Stage IA (cT1b, cN0, cM0, G2, ER+, PR+, HER2-) - Signed by Odean Potts, MD on 01/13/2023 Stage prefix: Initial diagnosis Histologic grading system: 3 grade system    SUMMARY OF ONCOLOGIC HISTORY: Oncology History  Malignant neoplasm of upper-outer quadrant of left breast in female, estrogen receptor positive (HCC)  01/07/2023 Initial Diagnosis   Screening mammogram detected left breast mass (0.9 cm) with distortion and calcifications spanning 1.8 cm posterior depth, axilla negative; biopsy of the mass: Grade 2 IDC ER 90%, PR 70%, HER2 2+ by IHC, FISH negative, Ki-67 1%   01/13/2023 Cancer Staging   Staging form: Breast, AJCC 8th Edition - Clinical: Stage IA (cT1b, cN0, cM0, G2, ER+, PR+, HER2-) - Signed by Odean Potts, MD on 01/13/2023 Stage prefix: Initial diagnosis Histologic grading system: 3 grade system   01/23/2023 Surgery   Left lumpectomy: Grade 2 IDC 1.4 cm with grade 1-2 DCIS, margins negative, LVI not identified, ER 90%, PR 70%, HER2 negative by FISH, Ki-67 1%    02/19/2023 Oncotype testing   Oncotype score of 27 (16% risk of recurrence)   03/25/2023 - 04/22/2023 Radiation Therapy   Plan Name: Breast_L Site: Breast, Left Technique: 3D Mode: Photon Dose Per Fraction: 2.66 Gy Prescribed Dose (Delivered / Prescribed): 42.56 Gy / 42.56 Gy Prescribed Fxs (Delivered / Prescribed): 16 / 16   Plan Name: Breast_L_Bst Site: Breast, Left Technique: 3D Mode: Photon Dose Per Fraction: 2 Gy Prescribed Dose (Delivered / Prescribed): 8 Gy / 8 Gy Prescribed Fxs (Delivered / Prescribed): 4 / 4   03/2023 -  Anti-estrogen oral therapy   Anastrozole  x 5 years     CURRENT THERAPY:  Exemestane   INTERVAL HISTORY:  Discussed the use of AI scribe software for clinical note transcription with the patient, who gave verbal consent to proceed.  History of Present Illness The patient is a 78 year old with stage one ERPR positive breast cancer who presents with a reaction to Exemestane .  She was diagnosed with stage one ER/PR positive breast cancer in August 2024 and underwent a lumpectomy, adjuvant radiation, and anti-estrogen therapy. Initially treated with anastrozole , she experienced significant difficulties, prompting a switch to Exemestane  in February 2025.  Since starting Exemestane , she developed severe itching and hives a few weeks ago, leading to skin abrasions and sleep disruption. She discontinued Exemestane  two weeks ago, resulting in symptom improvement. She experienced one episode of extremely wet hot flashes since stopping the medication.  She previously tried letrozole  and anastrozole , experiencing issues with both. Despite side effects, she is eager to continue cancer medication to prevent recurrence. She has a history of rashes and allergies, particularly in the summer, possibly related to farm exposure to allergens like red weeds.  Her current medications included Exemestane , which she has stopped due to the adverse reaction.  Of the Letrozole  and Anastrozole , the letrozole  was easier for her to tolerate.       Patient Active Problem List   Diagnosis Date Noted   Malignant neoplasm of upper-outer quadrant of left breast in female, estrogen receptor positive (HCC) 01/10/2023   Allergic rhinitis 08/29/2020   Anxiety about health 08/29/2020   Obesity 08/29/2020   Depression 08/29/2020   Generalized anxiety disorder 08/29/2020  Hearing loss 08/29/2020   Hyperlipidemia, unspecified 08/29/2020   Hypertension 08/29/2020   Osteoarthritis of knee 08/29/2020   Pain in left knee 08/29/2020   Pain of left hand 08/29/2020   Prediabetes 08/29/2020   Sleep  disturbance 08/29/2020   Tubular adenoma of colon 08/29/2020   Vitamin B12 deficiency (non anemic) 08/29/2020   Memory change 07/13/2015   History of concussion 07/13/2015   Left sided numbness 07/13/2015   Acquired hypothyroidism 04/22/2014    is allergic to doxycycline, gluten meal, and lisinopril.  MEDICAL HISTORY: Past Medical History:  Diagnosis Date   Anxiety    Arthritis    Bilateral Knees   Cancer (HCC) 2024   Left Breast Cancer   Headache    Hx   Hypertension    Hypothyroidism    Palpitations    PSVT runs, PSVC burden 1.2% 09/2022 Holter monitor   Pneumonia 2005   Pneumonia due to COVID-19 virus 2023   Pre-diabetes    Sleep apnea    Newly dx 2024. Has not received CPAP yet   Thyroid  disease     SURGICAL HISTORY: Past Surgical History:  Procedure Laterality Date   ABDOMINAL HYSTERECTOMY     APPENDECTOMY     BREAST BIOPSY Left 01/07/2023   US  LT BREAST BX W LOC DEV 1ST LESION IMG BX SPEC US  GUIDE 01/07/2023 [facility]   BREAST BIOPSY Left 01/07/2023   MM LT BREAST BX W LOC DEV 1ST LESION IMAGE BX SPEC STEREO GUIDE 01/07/2023 [facility]   BREAST BIOPSY  01/21/2023   MM LT RADIOACTIVE SEED LOC MAMMO GUIDE 01/21/2023 [facility]   BREAST LUMPECTOMY WITH RADIOACTIVE SEED LOCALIZATION Left 01/23/2023   Procedure: RADIOACTIVE SEED GUIDED LEFT BREAST LUMPECTOMY;  Surgeon: Vernetta Berg, MD;  Location: [facility] OR;  Service: General;  Laterality: Left;  LMA   BREAST REDUCTION SURGERY Bilateral    CHOLECYSTECTOMY      SOCIAL HISTORY: Social History   Socioeconomic History   Marital status: Single    Spouse name: Not on file   Number of children: Not on file   Years of education: Not on file   Highest education level: Not on file  Occupational History   Occupation: Retired  Tobacco Use   Smoking status: Former    Types: Cigarettes   Smokeless tobacco: Never  Vaping Use   Vaping status: Never Used  Substance and Sexual Activity   Alcohol  use: No    Alcohol/week: 0.0 standard drinks of alcohol   Drug use: Never   Sexual activity: Not Currently  Other Topics Concern   Not on file  Social History Narrative   Not on file   Social Drivers of Health   Financial Resource Strain: Low Risk  (06/11/2023)   Received from [facility]   Overall Financial Resource Strain (CARDIA)    Difficulty of Paying Living Expenses: Not hard at all  Food Insecurity: No Food Insecurity (06/11/2023)   Received from [facility]   Hunger Vital Sign    Within the past 12 months, you worried that your food would run out before you got the money to buy more.: Never true    Within the past 12 months, the food you bought just didn't last and you didn't have money to get more.: Never true  Transportation Needs: No Transportation Needs (06/11/2023)   Received from [facility] - Transportation    Lack of Transportation (Medical): No    Lack of Transportation (Non-Medical): No  Physical Activity: Insufficiently Active (02/10/2023)   Received from [facility]   Exercise Vital Sign    On average, how many days per week do you engage in moderate to strenuous exercise (like a brisk walk)?: 4 days    On average, how many minutes do you engage in exercise at this level?: 30 min  Stress: Stress Concern Present (02/10/2023)   Received from [facility] - Occupational Stress Questionnaire    Feeling of Stress : Very much  Social Connections: Socially Isolated (02/10/2023)   Received from [facility]   Social Network    How would you rate your social network (family, work, friends)?: Little participation, lonely and socially isolated  Intimate Partner Violence: Not At Risk (03/04/2023)   Humiliation, Afraid, Rape, and Kick questionnaire    Fear of Current or Ex-Partner: No    Emotionally Abused: No    Physically Abused: No    Sexually Abused: No    FAMILY HISTORY: Family History  Problem  Relation Age of Onset   Heart disease Father     Review of Systems  Constitutional:  Negative for appetite change, chills, fatigue, fever and unexpected weight change.  HENT:   Negative for hearing loss, lump/mass and trouble swallowing.   Eyes:  Negative for eye problems and icterus.  Respiratory:  Negative for chest tightness, cough and shortness of breath.   Cardiovascular:  Negative for chest pain, leg swelling and palpitations.  Gastrointestinal:  Negative for abdominal distention, abdominal pain, constipation, diarrhea, nausea and vomiting.  Endocrine: Negative for hot flashes.  Genitourinary:  Negative for difficulty urinating.   Musculoskeletal:  Negative for arthralgias.  Skin:  Positive for itching and rash.  Neurological:  Negative for dizziness, extremity weakness, headaches and numbness.  Hematological:  Negative for adenopathy. Does not bruise/bleed easily.  Psychiatric/Behavioral:  Negative for depression. The patient is not nervous/anxious.       PHYSICAL EXAMINATION    Vitals:   12/01/23 1034 12/01/23 1035  BP: (!) 142/61 134/60  Pulse: 76   Resp: 17   Temp: 98.3 F (36.8 C)   SpO2: 100%     Physical Exam Constitutional:      General: She is not in acute distress.    Appearance: Normal appearance. She is not toxic-appearing.  HENT:     Head: Normocephalic and atraumatic.     Mouth/Throat:     Mouth: Mucous membranes are moist.     Pharynx: Oropharynx is clear. No oropharyngeal exudate or posterior oropharyngeal erythema.  Eyes:     General: No scleral icterus. Cardiovascular:     Rate and Rhythm: Normal rate and regular rhythm.     Pulses: Normal pulses.     Heart sounds: Normal heart sounds.  Pulmonary:     Effort: Pulmonary effort is normal.     Breath sounds: Normal breath sounds.  Abdominal:     General: Abdomen is flat. Bowel sounds are normal. There is no distension.     Palpations: Abdomen is soft.     Tenderness: There is no abdominal  tenderness.  Musculoskeletal:        General: No swelling.     Cervical back: Neck supple.  Lymphadenopathy:     Cervical: No cervical adenopathy.  Skin:    General: Skin is warm and dry.     Findings: No rash.  Neurological:     General: No focal deficit present.     Mental Status: She is  alert.  Psychiatric:        Mood and Affect: Mood normal.        Behavior: Behavior normal.     ASSESSMENT and THERAPY PLAN:   Malignant neoplasm of upper-outer quadrant of left breast in female, estrogen receptor positive (HCC) 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, margins negative, LVI not identified, ER 90%, PR 70%, HER2 negative by FISH, Ki-67 1%  Oncotype score of 27 (16% risk of recurrence)   Treatment plan: I did not recommend adjuvant chemotherapy because of her age, performance status, mental state as I have a strong suspicion that she would not be able to handle chemotherapy due to mental health concerns and lack of support Adjuvant radiation therapy to be completed 04/02/2023 Followed by adjuvant antiestrogen therapy with Anastrozole  1 mg daily started 04/16/2023 (could not tolerate letrozole  in February, 2025 because of the similar but less severe issues) switched to exemestane  on 08/11/2023 for palpitations ---------------------------------------------------------------------------------------------------------------- Exemestane  toxicities: Rash- led to discontinuation  Since she tolerated Letrozole  better than anastrozole  and is very motivated to stay on antiestrogen therapy will send in Letrozole  and see her back in 3 weeks to assess tolerance.    She verbalized understanding and agrees with the above plan.   All questions were answered. The patient knows to call the clinic with any problems, questions or concerns. We can certainly see the patient much sooner if necessary.  Total encounter time:30 minutes*in face-to-face visit time, chart review, lab  review, care coordination, order entry, and documentation of the encounter time.    Morna Kendall, NP 12/01/23 1:31 PM Medical Oncology and Hematology [facility] [address] [place], [place] [address] Tel. [phone]    Fax. [phone]  *Total Encounter Time as defined by the Centers for Medicare and Medicaid Services includes, in addition to the face-to-face time of a patient visit (documented in the note above) non-face-to-face time: obtaining and reviewing outside history, ordering and reviewing medications, tests or procedures, care coordination (communications with other health care professionals or caregivers) and documentation in the medical record.

## 2023-12-01 NOTE — Assessment & Plan Note (Signed)
 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, margins negative, LVI not identified, ER 90%, PR 70%, HER2 negative by FISH, Ki-67 1%  Oncotype score of 27 (16% risk of recurrence)   Treatment plan: I did not recommend adjuvant chemotherapy because of her age, performance status, mental state as I have a strong suspicion that she would not be able to handle chemotherapy due to mental health concerns and lack of support Adjuvant radiation therapy to be completed 04/02/2023 Followed by adjuvant antiestrogen therapy with Anastrozole  1 mg daily started 04/16/2023 (could not tolerate letrozole  in February, 2025 because of the similar but less severe issues) switched to exemestane  on 08/11/2023 for palpitations ---------------------------------------------------------------------------------------------------------------- Exemestane  toxicities: Rash- led to discontinuation  Since she tolerated Letrozole  better than anastrozole  and is very motivated to stay on antiestrogen therapy will send in Letrozole  and see her back in 3 weeks to assess tolerance.    She verbalized understanding and agrees with the above plan.

## 2023-12-14 ENCOUNTER — Emergency Department (HOSPITAL_COMMUNITY)
Admission: EM | Admit: 2023-12-14 | Discharge: 2023-12-14 | Disposition: A | Discharge status: Home / Self Care | Attending: Emergency Medicine | Admitting: Emergency Medicine

## 2023-12-14 ENCOUNTER — Other Ambulatory Visit: Payer: Self-pay

## 2023-12-14 ENCOUNTER — Emergency Department (HOSPITAL_COMMUNITY)

## 2023-12-14 ENCOUNTER — Encounter (HOSPITAL_COMMUNITY): Payer: Self-pay

## 2023-12-14 DIAGNOSIS — I2510 Atherosclerotic heart disease of native coronary artery without angina pectoris: Secondary | ICD-10-CM | POA: Diagnosis not present

## 2023-12-14 DIAGNOSIS — Z853 Personal history of malignant neoplasm of breast: Secondary | ICD-10-CM | POA: Diagnosis not present

## 2023-12-14 DIAGNOSIS — R079 Chest pain, unspecified: Secondary | ICD-10-CM | POA: Insufficient documentation

## 2023-12-14 DIAGNOSIS — I10 Essential (primary) hypertension: Secondary | ICD-10-CM | POA: Diagnosis not present

## 2023-12-14 DIAGNOSIS — R002 Palpitations: Secondary | ICD-10-CM | POA: Insufficient documentation

## 2023-12-14 DIAGNOSIS — R918 Other nonspecific abnormal finding of lung field: Secondary | ICD-10-CM | POA: Diagnosis not present

## 2023-12-14 DIAGNOSIS — Z7982 Long term (current) use of aspirin: Secondary | ICD-10-CM | POA: Diagnosis not present

## 2023-12-14 DIAGNOSIS — R55 Syncope and collapse: Secondary | ICD-10-CM | POA: Insufficient documentation

## 2023-12-14 DIAGNOSIS — I771 Stricture of artery: Secondary | ICD-10-CM | POA: Diagnosis not present

## 2023-12-14 DIAGNOSIS — Z79899 Other long term (current) drug therapy: Secondary | ICD-10-CM | POA: Diagnosis not present

## 2023-12-14 DIAGNOSIS — R140 Abdominal distension (gaseous): Secondary | ICD-10-CM | POA: Diagnosis not present

## 2023-12-14 DIAGNOSIS — I700 Atherosclerosis of aorta: Secondary | ICD-10-CM | POA: Diagnosis not present

## 2023-12-14 DIAGNOSIS — Z9049 Acquired absence of other specified parts of digestive tract: Secondary | ICD-10-CM | POA: Diagnosis not present

## 2023-12-14 DIAGNOSIS — R0602 Shortness of breath: Secondary | ICD-10-CM | POA: Diagnosis present

## 2023-12-14 LAB — COMPREHENSIVE METABOLIC PANEL WITH GFR
ALT: 9 U/L (ref 0–44)
AST: 19 U/L (ref 15–41)
Albumin: 3.4 g/dL — ABNORMAL LOW (ref 3.5–5.0)
Alkaline Phosphatase: 87 U/L (ref 38–126)
Anion gap: 14 (ref 5–15)
BUN: 9 mg/dL (ref 8–23)
CO2: 21 mmol/L — ABNORMAL LOW (ref 22–32)
Calcium: 9.2 mg/dL (ref 8.9–10.3)
Chloride: 99 mmol/L (ref 98–111)
Creatinine, Ser: 0.69 mg/dL (ref 0.44–1.00)
GFR, Estimated: >60 mL/min (ref >60)
Glucose, Bld: 123 mg/dL — ABNORMAL HIGH (ref 70–99)
Potassium: 3.9 mmol/L (ref 3.5–5.1)
Sodium: 134 mmol/L — ABNORMAL LOW (ref 135–145)
Total Bilirubin: 0.6 mg/dL (ref 0.0–1.2)
Total Protein: 7 g/dL (ref 6.5–8.1)

## 2023-12-14 LAB — BRAIN NATRIURETIC PEPTIDE: B Natriuretic Peptide: 43 pg/mL (ref 0.0–100.0)

## 2023-12-14 LAB — CBC WITH DIFFERENTIAL/PLATELET
Abs Immature Granulocytes: 0.02 K/uL (ref 0.00–0.07)
Basophils Absolute: 0 K/uL (ref 0.0–0.1)
Basophils Relative: 0 %
Eosinophils Absolute: 0.2 K/uL (ref 0.0–0.5)
Eosinophils Relative: 2 %
HCT: 38.1 % (ref 36.0–46.0)
Hemoglobin: 12.7 g/dL (ref 12.0–15.0)
Immature Granulocytes: 0 %
Lymphocytes Relative: 41 %
Lymphs Abs: 3.2 K/uL (ref 0.7–4.0)
MCH: 32.6 pg (ref 26.0–34.0)
MCHC: 33.3 g/dL (ref 30.0–36.0)
MCV: 97.7 fL (ref 80.0–100.0)
Monocytes Absolute: 0.5 K/uL (ref 0.1–1.0)
Monocytes Relative: 6 %
Neutro Abs: 3.9 K/uL (ref 1.7–7.7)
Neutrophils Relative %: 51 %
Platelets: 301 K/uL (ref 150–400)
RBC: 3.9 MIL/uL (ref 3.87–5.11)
RDW: 12.9 % (ref 11.5–15.5)
WBC: 7.7 K/uL (ref 4.0–10.5)
nRBC: 0 % (ref 0.0–0.2)

## 2023-12-14 LAB — MAGNESIUM: Magnesium: 1.9 mg/dL (ref 1.7–2.4)

## 2023-12-14 LAB — TSH: TSH: 0.742 u[IU]/mL (ref 0.350–4.500)

## 2023-12-14 LAB — CBG MONITORING, ED: Glucose-Capillary: 125 mg/dL — ABNORMAL HIGH (ref 70–99)

## 2023-12-14 LAB — TROPONIN I (HIGH SENSITIVITY)
Troponin I (High Sensitivity): 4 ng/L (ref <18)
Troponin I (High Sensitivity): 4 ng/L (ref <18)

## 2023-12-14 LAB — LIPASE, BLOOD: Lipase: 27 U/L (ref 11–51)

## 2023-12-14 MED ORDER — IOHEXOL 350 MG/ML SOLN
75.0000 mL | Freq: Once | INTRAVENOUS | Status: AC | PRN
  Administered 2023-12-14: 75 mL via INTRAVENOUS

## 2023-12-14 MED ORDER — METOPROLOL TARTRATE 25 MG PO TABS
12.5000 mg | ORAL_TABLET | ORAL | 0 refills | Status: AC | PRN
Start: 2023-12-14 — End: ?

## 2023-12-14 NOTE — ED Triage Notes (Addendum)
 Patient from home for breathing difficulties and feeling her heart beating so fast. SOB started this afternoon; racing heart started while driving around [id number]. Also reports dry mouth and abd discomfort. Upon arrival to ER, patient is alert and oriented. HR WNL.

## 2023-12-14 NOTE — Discharge Instructions (Addendum)
 Your test results today were reassuring.  Continue your home medications as prescribed.    Talk to your oncologist about the letrozole .  This can cause a racing or pounding heart as a side effect.  There may better alternatives for antiestrogen medication.  A prescription for a medication called metoprolol  was sent to your pharmacy.  Take a half tablet as needed if you do have episodes of sustained fast heart rate.  Talk to your cardiologist about this medication as well as your recent symptoms.    Return to the emergency department for any new or worsening symptoms of concern.

## 2023-12-14 NOTE — ED Provider Notes (Signed)
 [place] EMERGENCY DEPARTMENT AT [facility] Provider Note   CSN: [id number] Arrival date & time: 12/14/23  9666     Patient presents with: No chief complaint on file.   Ruth Marquez is a 78 y.o. female.   HPI Patient presents for palpitations.  Medical history includes HTN, anxiety, depression, arthritis, prediabetes, breast cancer, SVT.  She is followed by [facility] cardiology.   A year ago, she wore a Holter monitor for 7 days which showed 386 episodes of brief SVT.  She was put on diltiazem.  Repeat monitoring in February showed frequent pauses but fewer SVT episodes.  She was taken off of Nebivolol and short acting diltiazem.  She was placed on low-dose long-acting diltiazem.  She was last seen in cardiology office 2 months ago.  Earlier today, she felt fatigued but was otherwise in her normal state of health.  This afternoon, while driving, she experienced palpitations and near syncope.  She had to pull over and let her sister drive.  She went home and laid down.  This evening, she was awakened by further palpitations.  She describes feeling her heart pounding very strongly.  She had some central chest pain.  The symptoms have since resolved.  Normally, she takes her diltiazem in the morning.  She took her dose today at around 3 PM.  Her breast cancer was diagnosed last year.  She underwent lumpectomy and radiation.  She is currently on antiestrogen therapy.  She was previously on letrozole  but discontinued due to side effects of palpitations.  She was placed on exemestane  but switched back to letrozole  2 weeks ago due to concern of possible rash from the exemestane .  Patient is concerned that symptoms today were related to going back onto letrozole .    Prior to Admission medications   Medication Sig Start Date End Date Taking? Authorizing Provider  metoprolol  tartrate (LOPRESSOR ) 25 MG tablet Take 0.5 tablets (12.5 mg total) by mouth as needed (sustained palpitations). 12/14/23   Yes Melvenia Motto, MD  acetaminophen  (TYLENOL ) 500 MG tablet Take 1,000-1,500 mg by mouth every 6 (six) hours as needed for moderate pain (pain score 4-6) or mild pain (pain score 1-3).    [provider]  ALPRAZolam (XANAX) 0.5 MG tablet Take 0.5 mg by mouth 2 (two) times daily as needed for anxiety.    [provider]  ASPIRIN LOW DOSE 81 MG tablet Take 81 mg by mouth every other day.    [provider]  atorvastatin (LIPITOR) 20 MG tablet Take 20 mg by mouth daily.    [provider]  citalopram (CELEXA) 20 MG tablet Take 20-40 mg by mouth at bedtime as needed (Sleep).    [provider]  diltiazem (CARDIZEM SR) 120 MG 12 hr capsule Take 120 mg by mouth daily.    [provider]  hyoscyamine (ANASPAZ) 0.125 MG TBDP disintergrating tablet Place 0.125 mg under the tongue every 6 (six) hours as needed for cramping.    [provider]  ibuprofen (ADVIL) 200 MG tablet Take 200 mg by mouth every 6 (six) hours as needed for moderate pain (pain score 4-6) or mild pain (pain score 1-3).    [provider]  isosorbide mononitrate (IMDUR) 60 MG 24 hr tablet Take 60 mg by mouth 2 (two) times daily.    [provider]  ketoconazole (NIZORAL) 2 % cream Apply 1 Application topically daily as needed for irritation (Psoriasis).    [provider]  letrozole  (  FEMARA ) 2.5 MG tablet Take 1 tablet (2.5 mg total) by mouth daily. 12/01/23   Crawford Morna Pickle, NP  levothyroxine (SYNTHROID) 112 MCG tablet Take 112 mcg by mouth daily. 01/26/14   [provider]  losartan (COZAAR) 50 MG tablet Take 50 mg by mouth daily. 07/09/23   [provider]  magnesium gluconate (MAGONATE) 500 MG tablet Take 500 mg by mouth once a week.    [provider]  montelukast (SINGULAIR) 10 MG tablet Take 10 mg by mouth at bedtime. 09/09/22   [provider]  OVER THE COUNTER MEDICATION Take 1 tablet by mouth once a  week. Omega xl    [provider]  Probiotic Product (PROBIOTIC PO) Take 2 capsules by mouth daily as needed (Constipation). Emma    [provider]  sodium chloride  (OCEAN) 0.65 % SOLN nasal spray Place 1-2 sprays into both nostrils as needed for congestion.    [provider]  sulconazole nitrate 1 % external solution Apply 1 application  topically daily as needed (Psoriasis).    [provider]    Allergies: Doxycycline, Gluten meal, and Lisinopril    Review of Systems  Constitutional:  Positive for fatigue.  Cardiovascular:  Positive for chest pain and palpitations.  Neurological:  Positive for light-headedness.  All other systems reviewed and are negative.   Updated Vital Signs BP 129/62   Pulse (!) 59   Temp 98.2 F (36.8 C) (Oral)   Resp 12   Ht 5' 5 (1.651 m)   Wt 112 kg   SpO2 96%   BMI 41.10 kg/m   Physical Exam Vitals and nursing note reviewed.  Constitutional:      General: She is not in acute distress.    Appearance: Normal appearance. She is well-developed. She is not ill-appearing, toxic-appearing or diaphoretic.  HENT:     Head: Normocephalic and atraumatic.     Right Ear: External ear normal.     Left Ear: External ear normal.     Nose: Nose normal.     Mouth/Throat:     Mouth: Mucous membranes are moist.  Eyes:     Extraocular Movements: Extraocular movements intact.     Conjunctiva/sclera: Conjunctivae normal.  Cardiovascular:     Rate and Rhythm: Normal rate and regular rhythm.     Heart sounds: No murmur heard. Pulmonary:     Effort: Pulmonary effort is normal. No respiratory distress.     Breath sounds: Normal breath sounds. No wheezing or rales.  Chest:     Chest wall: No tenderness.  Abdominal:     General: There is no distension.     Palpations: Abdomen is soft.  Musculoskeletal:        General: No swelling. Normal range of motion.     Cervical back: Normal range of motion and neck supple.  Skin:     General: Skin is warm and dry.     Coloration: Skin is not jaundiced or pale.  Neurological:     General: No focal deficit present.     Mental Status: She is alert and oriented to person, place, and time.  Psychiatric:        Mood and Affect: Mood normal.        Behavior: Behavior normal.     (all labs ordered are listed, but only abnormal results are displayed) Labs Reviewed  COMPREHENSIVE METABOLIC PANEL WITH GFR - Abnormal; Notable for the following components:      Result Value  Sodium 134 (*)    CO2 21 (*)    Glucose, Bld 123 (*)    Albumin 3.4 (*)    All other components within normal limits  CBG MONITORING, ED - Abnormal; Notable for the following components:   Glucose-Capillary 125 (*)    All other components within normal limits  MAGNESIUM  LIPASE, BLOOD  BRAIN NATRIURETIC PEPTIDE  CBC WITH DIFFERENTIAL/PLATELET  TSH  TROPONIN I (HIGH SENSITIVITY)  TROPONIN I (HIGH SENSITIVITY)    EKG: None  Radiology: CT Angio Chest PE W and/or Wo Contrast Result Date: 12/14/2023 CLINICAL DATA:  Evaluate for pulmonary embolism. EXAM: CT ANGIOGRAPHY CHEST WITH CONTRAST TECHNIQUE: Multidetector CT imaging of the chest was performed using the standard protocol during bolus administration of intravenous contrast. Multiplanar CT image reconstructions and MIPs were obtained to evaluate the vascular anatomy. RADIATION DOSE REDUCTION: This exam was performed according to the departmental dose-optimization program which includes automated exposure control, adjustment of the mA and/or kV according to patient size and/or use of iterative reconstruction technique. CONTRAST:  75mL OMNIPAQUE  IOHEXOL  350 MG/ML SOLN COMPARISON:  None Available. FINDINGS: Cardiovascular: Satisfactory opacification of the pulmonary arteries to the segmental level. No evidence of pulmonary embolism. Normal heart size. No pericardial effusion. Aortic atherosclerosis. Coronary artery calcifications. Mediastinum/Nodes: No  enlarged mediastinal, hilar, or axillary lymph nodes. Thyroid  gland, trachea, and esophagus demonstrate no significant findings. Lungs/Pleura: Decreased AP diameter of the trachea and central airways. No pleural effusion, airspace consolidation, or pneumothorax. Mild mosaic attenuation pattern noted. Upper Abdomen: No acute abnormality.  Status post cholecystectomy. Musculoskeletal: No chest wall abnormality. No acute or significant osseous findings. Review of the MIP images confirms the above findings. IMPRESSION: 1. No evidence for acute pulmonary embolus. 2. Decreased AP diameter of the trachea and central airways. This is a nonspecific finding but can be seen in the setting of tracheobronchomalacia. 3. Mild mosaic attenuation pattern noted within the lungs. This is a nonspecific finding but can be seen in the setting of small airways disease. 4. Coronary artery calcifications. 5.  Aortic Atherosclerosis (ICD10-I70.0). Electronically Signed   By: Waddell Calk M.D.   On: 12/14/2023 05:48   DG Chest Portable 1 View Result Date: 12/14/2023 CLINICAL DATA:  78 year old female with palpitations, shortness of breath. EXAM: PORTABLE CHEST 1 VIEW COMPARISON:  Chest radiographs 05/28/2022 and earlier. FINDINGS: Portable AP supine view at 0419 hours. Stable mild tortuosity of the thoracic aorta. Heart size and other mediastinal contours within normal limits. Visualized tracheal air column is within normal limits. Mild chronic elevation of the left hemidiaphragm, normal variant. Allowing for portable technique the lungs are clear. No pneumothorax or pleural effusion evident on this supine view. No acute osseous abnormality identified. Paucity of bowel gas. IMPRESSION: No acute cardiopulmonary abnormality. Electronically Signed   By: VEAR Hurst M.D.   On: 12/14/2023 04:33     Procedures   Medications Ordered in the ED  iohexol  (OMNIPAQUE ) 350 MG/ML injection 75 mL (75 mLs Intravenous Contrast Given 12/14/23 0502)                                     Medical Decision Making Amount and/or Complexity of Data Reviewed Labs: ordered. Radiology: ordered.  Risk Prescription drug management.   This patient presents to the ED for concern of palpitations, this involves an extensive number of treatment options, and is a complaint that carries with it a high risk of  complications and morbidity.  The differential diagnosis includes arrhythmia, anxiety, metabolic derangements, ACS, PE   Co morbidities / Chronic conditions that complicate the patient evaluation  HTN, anxiety, depression, arthritis, prediabetes, breast cancer, SVT   Additional history obtained:  Additional history obtained from EMR External records from outside source obtained and reviewed including N/A   Lab Tests:  I Ordered, and personally interpreted labs.  The pertinent results include: Normal hemoglobin, no leukocytosis, normal kidney function, normal electrolytes, normal troponins x 2   Imaging Studies ordered:  I ordered imaging studies including chest x-ray, CTA chest I independently visualized and interpreted imaging which showed no acute findings I agree with the radiologist interpretation   Cardiac Monitoring: / EKG:  The patient was maintained on a cardiac monitor.  I personally viewed and interpreted the cardiac monitored which showed an underlying rhythm of: Sinus rhythm   Problem List / ED Course / Critical interventions / Medication management  Patient presenting for transient symptoms of near syncope, palpitations, chest pain throughout the afternoon and evening.  On arrival in the ED, patient has a normal heart rate.  She denies any current palpitations or chest pain.  EKG shows no concerning ST segment abnormalities.  Current blood pressure is only slightly elevated.  Patient was kept on monitor.  Workup was initiated.  Lab work was reassuring.  On monitor, patient remained in normal sinus rhythm.  She had an  approximately 8-second stretch where she seemed to have a multifocal atrial tachycardia.  She remained asymptomatic while in the ED.  CT of chest did not show any acute findings.  Patient symptoms may be secondary to restarting letrozole .  She states that she only restarted this a week ago and did have similar symptoms when she was on it previously.  Exemestane  caused her some pruritus, but she feels that she would prefer that to palpitations.  Patient was advised to discuss estrogen blocking medications with her oncologist.  Will prescribe low-dose as needed metoprolol .  Patient to follow-up with her cardiologist as well.  She is stable for discharge.  Social Determinants of Health:  Has access to outpatient care     Final diagnoses:  Palpitations    ED Discharge Orders          Ordered    metoprolol  tartrate (LOPRESSOR ) 25 MG tablet  As needed        12/14/23 9371               Melvenia Motto, MD 12/14/23 [phone]

## 2023-12-16 ENCOUNTER — Telehealth: Payer: Self-pay | Admitting: *Deleted

## 2023-12-16 NOTE — Telephone Encounter (Signed)
 Received after hours call from pt stating her BP has been in the 200's systolic and was seen in ED for palpitations and was advised to stop Letrozole  and f/u with office.  Per MD, pt needing to continue to hold letrozole  and pt is already scheduled to f/u with NP next week.  RN attempt x1 to contact pt.  No answer, LVM for pt to return call to the office.

## 2023-12-18 ENCOUNTER — Ambulatory Visit
Admission: RE | Admit: 2023-12-18 | Discharge: 2023-12-18 | Disposition: A | Discharge status: Home / Self Care | Payer: Medicare HMO | Source: Ambulatory Visit | Attending: Adult Health | Admitting: Adult Health

## 2023-12-18 DIAGNOSIS — Z08 Encounter for follow-up examination after completed treatment for malignant neoplasm: Secondary | ICD-10-CM | POA: Diagnosis not present

## 2023-12-18 DIAGNOSIS — Z853 Personal history of malignant neoplasm of breast: Secondary | ICD-10-CM | POA: Diagnosis not present

## 2023-12-18 DIAGNOSIS — Z170 Estrogen receptor positive status [ER+]: Secondary | ICD-10-CM

## 2023-12-22 DIAGNOSIS — I10 Essential (primary) hypertension: Secondary | ICD-10-CM | POA: Diagnosis not present

## 2023-12-22 DIAGNOSIS — R002 Palpitations: Secondary | ICD-10-CM | POA: Diagnosis not present

## 2023-12-22 DIAGNOSIS — I471 Supraventricular tachycardia, unspecified: Secondary | ICD-10-CM | POA: Diagnosis not present

## 2023-12-22 DIAGNOSIS — Z1330 Encounter for screening examination for mental health and behavioral disorders, unspecified: Secondary | ICD-10-CM | POA: Diagnosis not present

## 2023-12-25 ENCOUNTER — Inpatient Hospital Stay: Admitting: Adult Health

## 2023-12-25 ENCOUNTER — Encounter: Payer: Self-pay | Admitting: Adult Health

## 2023-12-25 VITALS — BP 144/60 | HR 83 | Temp 97.6°F | Resp 18 | Ht 65.0 in | Wt 250.9 lb

## 2023-12-25 DIAGNOSIS — Z1721 Progesterone receptor positive status: Secondary | ICD-10-CM | POA: Diagnosis not present

## 2023-12-25 DIAGNOSIS — Z1732 Human epidermal growth factor receptor 2 negative status: Secondary | ICD-10-CM | POA: Diagnosis not present

## 2023-12-25 DIAGNOSIS — Z79811 Long term (current) use of aromatase inhibitors: Secondary | ICD-10-CM | POA: Diagnosis not present

## 2023-12-25 DIAGNOSIS — C50412 Malignant neoplasm of upper-outer quadrant of left female breast: Secondary | ICD-10-CM

## 2023-12-25 DIAGNOSIS — Z170 Estrogen receptor positive status [ER+]: Secondary | ICD-10-CM | POA: Diagnosis not present

## 2023-12-25 DIAGNOSIS — I10 Essential (primary) hypertension: Secondary | ICD-10-CM | POA: Diagnosis not present

## 2023-12-25 DIAGNOSIS — Z923 Personal history of irradiation: Secondary | ICD-10-CM | POA: Diagnosis not present

## 2023-12-25 DIAGNOSIS — R002 Palpitations: Secondary | ICD-10-CM | POA: Diagnosis not present

## 2023-12-25 DIAGNOSIS — Z87891 Personal history of nicotine dependence: Secondary | ICD-10-CM | POA: Diagnosis not present

## 2023-12-25 MED ORDER — TAMOXIFEN CITRATE 20 MG PO TABS: 20.0000 mg | ORAL_TABLET | Freq: Every day | ORAL | 3 refills | Status: DC

## 2023-12-25 NOTE — Progress Notes (Unsigned)
 [facility] Cancer Follow up:    Ruth Charleston, MD [address] [place] [address]   DIAGNOSIS: Cancer Staging  Malignant neoplasm of upper-outer quadrant of left breast in female, estrogen receptor positive (HCC) Staging form: Breast, AJCC 8th Edition - Clinical: Stage IA (cT1b, cN0, cM0, G2, ER+, PR+, HER2-) - Signed by Odean Potts, MD on 01/13/2023 Stage prefix: Initial diagnosis Histologic grading system: 3 grade system    SUMMARY OF ONCOLOGIC HISTORY: Oncology History  Malignant neoplasm of upper-outer quadrant of left breast in female, estrogen receptor positive (HCC)  01/07/2023 Initial Diagnosis   Screening mammogram detected left breast mass (0.9 cm) with distortion and calcifications spanning 1.8 cm posterior depth, axilla negative; biopsy of the mass: Grade 2 IDC ER 90%, PR 70%, HER2 2+ by IHC, FISH negative, Ki-67 1%   01/13/2023 Cancer Staging   Staging form: Breast, AJCC 8th Edition - Clinical: Stage IA (cT1b, cN0, cM0, G2, ER+, PR+, HER2-) - Signed by Odean Potts, MD on 01/13/2023 Stage prefix: Initial diagnosis Histologic grading system: 3 grade system   01/23/2023 Surgery   Left lumpectomy: Grade 2 IDC 1.4 cm with grade 1-2 DCIS, margins negative, LVI not identified, ER 90%, PR 70%, HER2 negative by FISH, Ki-67 1%    02/19/2023 Oncotype testing   Oncotype score of 27 (16% risk of recurrence)   03/25/2023 - 04/22/2023 Radiation Therapy   Plan Name: Breast_L Site: Breast, Left Technique: 3D Mode: Photon Dose Per Fraction: 2.66 Gy Prescribed Dose (Delivered / Prescribed): 42.56 Gy / 42.56 Gy Prescribed Fxs (Delivered / Prescribed): 16 / 16   Plan Name: Breast_L_Bst Site: Breast, Left Technique: 3D Mode: Photon Dose Per Fraction: 2 Gy Prescribed Dose (Delivered / Prescribed): 8 Gy / 8 Gy Prescribed Fxs (Delivered / Prescribed): 4 / 4   03/2023 -  Anti-estrogen oral therapy   Anastrozole  x 5 years     CURRENT THERAPY:  Anastrozole   INTERVAL HISTORY:  Discussed the use of AI scribe software for clinical note transcription with the patient, who gave verbal consent to proceed.  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 Initially on anastrozole , she switched to letrozole  due to side effects. Severe reactions to cancer medications included an episode mimicking a heart attack.  While on letrozole , she experienced hypertension and palpitations, which have improved since discontinuing the medication. Currently, she has minimal palpitations, managed with regular diltiazem and an extra dose of Valium as needed. She also experienced itching and hives with exemestane , which she tolerated less well than letrozole .  No personal history of blood clots and no known family history of clotting disorders. She has a history of hay fever and allergies, which are particularly bad during this time of year.     Patient Active Problem List   Diagnosis Date Noted   Malignant neoplasm of upper-outer quadrant of left breast in female, estrogen receptor positive (HCC) 01/10/2023   Allergic rhinitis 08/29/2020   Anxiety about health 08/29/2020   Obesity 08/29/2020   Depression 08/29/2020   Generalized anxiety disorder 08/29/2020   Hearing loss 08/29/2020   Hyperlipidemia, unspecified 08/29/2020   Hypertension 08/29/2020   Osteoarthritis of knee 08/29/2020  Pain in left knee 08/29/2020   Pain of left hand 08/29/2020   Prediabetes 08/29/2020   Sleep disturbance 08/29/2020   Tubular adenoma of colon 08/29/2020   Vitamin B12 deficiency (non anemic) 08/29/2020   Memory change 07/13/2015    History of concussion 07/13/2015   Left sided numbness 07/13/2015   Acquired hypothyroidism 04/22/2014    is allergic to doxycycline, gluten meal, and lisinopril.  MEDICAL HISTORY: Past Medical History:  Diagnosis Date   Anxiety    Arthritis    Bilateral Knees   Cancer (HCC) 2024   Left Breast Cancer   Headache    Hx   Hypertension    Hypothyroidism    Palpitations    PSVT runs, PSVC burden 1.2% 09/2022 Holter monitor   Pneumonia 2005   Pneumonia due to COVID-19 virus 2023   Pre-diabetes    Sleep apnea    Newly dx 2024. Has not received CPAP yet   Thyroid  disease     SURGICAL HISTORY: Past Surgical History:  Procedure Laterality Date   ABDOMINAL HYSTERECTOMY     APPENDECTOMY     BREAST BIOPSY Left 01/07/2023   US  LT BREAST BX W LOC DEV 1ST LESION IMG BX SPEC US  GUIDE 01/07/2023 [facility]   BREAST BIOPSY Left 01/07/2023   MM LT BREAST BX W LOC DEV 1ST LESION IMAGE BX SPEC STEREO GUIDE 01/07/2023 [facility]   BREAST BIOPSY  01/21/2023   MM LT RADIOACTIVE SEED LOC MAMMO GUIDE 01/21/2023 [facility]   BREAST LUMPECTOMY WITH RADIOACTIVE SEED LOCALIZATION Left 01/23/2023   Procedure: RADIOACTIVE SEED GUIDED LEFT BREAST LUMPECTOMY;  Surgeon: Vernetta Berg, MD;  Location: [facility] OR;  Service: General;  Laterality: Left;  LMA   BREAST REDUCTION SURGERY Bilateral    CHOLECYSTECTOMY      SOCIAL HISTORY: Social History   Socioeconomic History   Marital status: Single    Spouse name: Not on file   Number of children: Not on file   Years of education: Not on file   Highest education level: Not on file  Occupational History   Occupation: Retired  Tobacco Use   Smoking status: Former    Types: Cigarettes   Smokeless tobacco: Never  Vaping Use   Vaping status: Never Used  Substance and Sexual Activity   Alcohol use: No    Alcohol/week: 0.0 standard drinks of alcohol   Drug use: Never   Sexual activity: Not Currently  Other Topics Concern   Not on  file  Social History Narrative   Not on file   Social Drivers of Health   Financial Resource Strain: Low Risk  (06/11/2023)   Received from [facility]   Overall Financial Resource Strain (CARDIA)    Difficulty of Paying Living Expenses: Not hard at all  Food Insecurity: No Food Insecurity (06/11/2023)   Received from [facility]   Hunger Vital Sign    Within the past 12 months, you worried that your food would run out before you got the money to buy more.: Never true    Within the past 12 months, the food you bought just didn't last and you didn't have money to get more.: Never true  Transportation Needs: No Transportation Needs (06/11/2023)   Received from [facility] - Transportation    Lack of Transportation (Medical): No    Lack of Transportation (Non-Medical): No  Physical Activity: Insufficiently Active (02/10/2023)   Received from [facility]   Exercise Vital Sign    On  average, how many days per week do you engage in moderate to strenuous exercise (like a brisk walk)?: 4 days    On average, how many minutes do you engage in exercise at this level?: 30 min  Stress: Stress Concern Present (02/10/2023)   Received from [facility] - Occupational Stress Questionnaire    Feeling of Stress : Very much  Social Connections: Socially Isolated (02/10/2023)   Received from [facility]   Social Network    How would you rate your social network (family, work, friends)?: Little participation, lonely and socially isolated  Intimate Partner Violence: Not At Risk (03/04/2023)   Humiliation, Afraid, Rape, and Kick questionnaire    Fear of Current or Ex-Partner: No    Emotionally Abused: No    Physically Abused: No    Sexually Abused: No    FAMILY HISTORY: Family History  Problem Relation Age of Onset   Heart disease Father     Review of Systems  Constitutional:  Negative for appetite change, chills, fatigue, fever and  unexpected weight change.  HENT:   Negative for hearing loss, lump/mass and trouble swallowing.   Eyes:  Negative for eye problems and icterus.  Respiratory:  Negative for chest tightness, cough and shortness of breath.   Cardiovascular:  Negative for chest pain, leg swelling and palpitations.  Gastrointestinal:  Negative for abdominal distention, abdominal pain, constipation, diarrhea, nausea and vomiting.  Endocrine: Negative for hot flashes.  Genitourinary:  Negative for difficulty urinating.   Musculoskeletal:  Negative for arthralgias.  Skin:  Negative for itching and rash.  Neurological:  Negative for dizziness, extremity weakness, headaches and numbness.  Hematological:  Negative for adenopathy. Does not bruise/bleed easily.  Psychiatric/Behavioral:  Negative for depression. The patient is not nervous/anxious.       PHYSICAL EXAMINATION    Vitals:   12/25/23 1142  BP: (!) 144/60  Pulse: 83  Resp: 18  Temp: 97.6 F (36.4 C)  SpO2: 98%    Physical Exam Constitutional:      General: She is not in acute distress.    Appearance: Normal appearance. She is not toxic-appearing.  HENT:     Head: Normocephalic and atraumatic.     Mouth/Throat:     Mouth: Mucous membranes are moist.     Pharynx: Oropharynx is clear. No oropharyngeal exudate or posterior oropharyngeal erythema.  Eyes:     General: No scleral icterus. Cardiovascular:     Rate and Rhythm: Normal rate and regular rhythm.     Pulses: Normal pulses.     Heart sounds: Normal heart sounds.  Pulmonary:     Effort: Pulmonary effort is normal.     Breath sounds: Normal breath sounds.  Abdominal:     General: Abdomen is flat. Bowel sounds are normal. There is no distension.     Palpations: Abdomen is soft.     Tenderness: There is no abdominal tenderness.  Musculoskeletal:        General: No swelling.     Cervical back: Neck supple.  Lymphadenopathy:     Cervical: No cervical adenopathy.  Skin:    General:  Skin is warm and dry.     Findings: No rash.  Neurological:     General: No focal deficit present.     Mental Status: She is alert.  Psychiatric:        Mood and Affect: Mood normal.        Behavior: Behavior normal.  LABORATORY DATA:  CBC    Component Value Date/Time   WBC 7.7 12/14/2023 0356   RBC 3.90 12/14/2023 0356   HGB 12.7 12/14/2023 0356   HGB 12.9 01/13/2023 1049   HCT 38.1 12/14/2023 0356   PLT 301 12/14/2023 0356   PLT 297 01/13/2023 1049   MCV 97.7 12/14/2023 0356   MCH 32.6 12/14/2023 0356   MCHC 33.3 12/14/2023 0356   RDW 12.9 12/14/2023 0356   LYMPHSABS 3.2 12/14/2023 0356   MONOABS 0.5 12/14/2023 0356   EOSABS 0.2 12/14/2023 0356   BASOSABS 0.0 12/14/2023 0356    CMP     Component Value Date/Time   NA 134 (L) 12/14/2023 0356   K 3.9 12/14/2023 0356   CL 99 12/14/2023 0356   CO2 21 (L) 12/14/2023 0356   GLUCOSE 123 (H) 12/14/2023 0356   BUN 9 12/14/2023 0356   CREATININE 0.69 12/14/2023 0356   CREATININE 0.77 01/13/2023 1049   CALCIUM 9.2 12/14/2023 0356   PROT 7.0 12/14/2023 0356   ALBUMIN 3.4 (L) 12/14/2023 0356   AST 19 12/14/2023 0356   AST 20 01/13/2023 1049   ALT 9 12/14/2023 0356   ALT 18 01/13/2023 1049   ALKPHOS 87 12/14/2023 0356   BILITOT 0.6 12/14/2023 0356   BILITOT 0.5 01/13/2023 1049   GFRNONAA >60 12/14/2023 0356   GFRNONAA >60 01/13/2023 1049   GFRAA >60 12/21/2019 1225     ASSESSMENT and THERAPY PLAN:  Assessment and Plan Assessment & Plan Stage IA left breast invasive ductal carcinoma, post-lumpectomy, on adjuvant endocrine therapy with intolerance to aromatase inhibitors Status post-lumpectomy and adjuvant radiation. Aromatase inhibitors not recommended due to intolerance. Tamoxifen  considered as an alternative with lower risk of palpitations but risk of blood clots. No increased risk of endometrial cancer due to prior hysterectomy. Discussed tamoxifen 's effectiveness in reducing recurrence risk to 16% compared to  32% without treatment and 14-15% with aromatase inhibitors. - Prescribe tamoxifen . - Order Guardant Reveal blood test every six months to detect circulating tumor DNA. - Provide handout on blood clot symptoms and when to seek medical attention.  Palpitations and hypertension, likely medication-induced Palpitations and hypertension likely induced by letrozole . Symptoms improved after discontinuation. Currently managed with diltiazem. No personal history of blood clots, but vigilance advised due to potential tamoxifen  use. - Continue diltiazem for palpitations. - Monitor for symptoms of blood clots, especially if starting tamoxifen .  RTC in 4 weeks with Dr. Gudena.    All questions were answered. The patient knows to call the clinic with any problems, questions or concerns. We can certainly see the patient much sooner if necessary.  Total encounter time:30 minutes*in face-to-face visit time, chart review, lab review, care coordination, order entry, and documentation of the encounter time.    Morna Kendall, NP 12/25/23 12:01 PM Medical Oncology and Hematology [facility] [address] [place], [place] [address] Tel. [phone]    Fax. [phone]  *Total Encounter Time as defined by the Centers for Medicare and Medicaid Services includes, in addition to the face-to-face time of a patient visit (documented in the note above) non-face-to-face time: obtaining and reviewing outside history, ordering and reviewing medications, tests or procedures, care coordination (communications with other health care professionals or caregivers) and documentation in the medical record.

## 2023-12-25 NOTE — Patient Instructions (Signed)
 Tamoxifen  Tablets What is this medication? TAMOXIFEN  (ta MOX i fen) prevents and treats breast cancer. It works by blocking the hormone estrogen in breast tissue, which prevents breast cancer cells from spreading or growing. This medicine may be used for other purposes; ask your health care provider or pharmacist if you have questions. COMMON BRAND NAME(S): Nolvadex  What should I tell my care team before I take this medication? They need to know if you have any of these conditions: Blood clots Endometriosis High cholesterol Irregular menstrual cycles Liver disease Stroke Uterine cancer Uterine fibroids An unusual reaction to tamoxifen , other medications, foods, dyes, or preservatives Pregnant or trying to get pregnant Breast-feeding How should I use this medication? Take this medication by mouth. Take it as directed on the prescription label at the same time every day. You can take it with or without food. If it upsets your stomach, take it with food. Keep taking it unless your care team tells you to stop. A special MedGuide will be given to you by the pharmacist with each prescription and refill. Be sure to read this information carefully each time. Talk to your care team about the use of this medication in children. Special care may be needed. Overdosage: If you think you have taken too much of this medicine contact a poison control center or emergency room at once. NOTE: This medicine is only for you. Do not share this medicine with others. What if I miss a dose? If you miss a dose, take it as soon as you can. If it is almost time for your next dose, take only that dose. Do not take double or extra doses. What may interact with this medication? Do not take this medication with any of the following: Cisapride Dronedarone Ketoconazole Levoketoconazole Pimozide Thioridazine This medication may also interact with the following: Anastrozole  Certain medications for seizures, such as  carbamazepine, phenobarbital, phenytoin Letrozole  Other medications that cause heart rhythm changes Paroxetine Rifampin Warfarin This list may not describe all possible interactions. Give your health care provider a list of all the medicines, herbs, non-prescription drugs, or dietary supplements you use. Also tell them if you smoke, drink alcohol, or use illegal drugs. Some items may interact with your medicine. What should I watch for while using this medication? Visit your care team for regular checks on your progress. You will need regular pelvic exams, breast exams, and mammograms. Talk to your care team about your risk of uterine cancer. You may be more at risk for uterine cancer if you take this medication. Talk to your care team if you may be pregnant. Serious birth defects can occur if you take this medication during pregnancy. Do not breastfeed while taking this medication and for 3 months after the last dose. This medication may cause infertility. Talk with your care team if you are concerned about your fertility. What side effects may I notice from receiving this medication? Side effects that you should report to your care team as soon as possible: Allergic reactions--skin rash, itching, hives, swelling of the face, lips, tongue, or throat Blood clot--pain, swelling, or warmth in the leg, shortness of breath, chest pain High calcium level--increased thirst or amount of urine, nausea, vomiting, confusion, unusual weakness or fatigue, bone pain Infection--fever, chills, cough, or sore throat Irregular menstrual cycles or spotting Liver injury--right upper belly pain, loss of appetite, nausea, light-colored stool, dark yellow or brown urine, yellowing skin or eyes, unusual weakness or fatigue Low red blood cell count--unusual weakness or fatigue,  dizziness, headache, trouble breathing Stroke--sudden numbness or weakness in the face, arm, or leg, trouble speaking, confusion, trouble  walking, loss of balance or coordination, dizziness, severe headache, change in vision Unusual bruising or bleeding Vaginal bleeding after menopause, pelvic pain Side effects that usually do not require medical attention (report to your care team if they continue or are bothersome): Hot flashes Mood swings Vaginal discharge This list may not describe all possible side effects. Call your doctor for medical advice about side effects. You may report side effects to FDA at 1-800-FDA-1088. Where should I keep my medication? Keep out of the reach of children and pets. Store at room temperature between 20 and 25 degrees C (68 and 77 degrees F). Protect from light. Get rid of any unused medication after the expiration date. To get rid of medications that are no longer needed or have expired: Take the medication to a medication take-back program. Check with your pharmacy or law enforcement to find a location. If you cannot return the medication, check the label or package insert to see if the medication should be thrown out in the garbage or flushed down the toilet. If you are not sure, ask your care team. If it is safe to put it in the trash, empty the medication out of the container. Mix the medication with cat litter, dirt, coffee grounds, or other unwanted substance. Seal the mixture in a bag or container. Put it in the trash. NOTE: This sheet is a summary. It may not cover all possible information. If you have questions about this medicine, talk to your doctor, pharmacist, or health care provider.  2024 Elsevier/Gold Standard (2022-10-11 00:00:00)

## 2024-01-02 DIAGNOSIS — M65311 Trigger thumb, right thumb: Secondary | ICD-10-CM | POA: Diagnosis not present

## 2024-01-02 DIAGNOSIS — M179 Osteoarthritis of knee, unspecified: Secondary | ICD-10-CM | POA: Diagnosis not present

## 2024-01-19 ENCOUNTER — Inpatient Hospital Stay: Payer: Medicare HMO | Attending: Adult Health | Admitting: Hematology and Oncology

## 2024-01-19 VITALS — BP 126/54 | HR 77 | Temp 98.0°F | Resp 16 | Wt 246.8 lb

## 2024-01-19 DIAGNOSIS — Z79811 Long term (current) use of aromatase inhibitors: Secondary | ICD-10-CM | POA: Insufficient documentation

## 2024-01-19 DIAGNOSIS — C50412 Malignant neoplasm of upper-outer quadrant of left female breast: Secondary | ICD-10-CM | POA: Insufficient documentation

## 2024-01-19 DIAGNOSIS — R002 Palpitations: Secondary | ICD-10-CM | POA: Diagnosis not present

## 2024-01-19 DIAGNOSIS — N951 Menopausal and female climacteric states: Secondary | ICD-10-CM | POA: Diagnosis not present

## 2024-01-19 DIAGNOSIS — R232 Flushing: Secondary | ICD-10-CM | POA: Insufficient documentation

## 2024-01-19 DIAGNOSIS — Z923 Personal history of irradiation: Secondary | ICD-10-CM | POA: Diagnosis not present

## 2024-01-19 DIAGNOSIS — Z1732 Human epidermal growth factor receptor 2 negative status: Secondary | ICD-10-CM | POA: Diagnosis not present

## 2024-01-19 DIAGNOSIS — Z170 Estrogen receptor positive status [ER+]: Secondary | ICD-10-CM | POA: Diagnosis not present

## 2024-01-19 DIAGNOSIS — Z1721 Progesterone receptor positive status: Secondary | ICD-10-CM | POA: Diagnosis not present

## 2024-01-19 MED ORDER — LETROZOLE 2.5 MG PO TABS: 2.5000 mg | ORAL_TABLET | Freq: Every day | ORAL | 3 refills | Status: DC

## 2024-01-19 NOTE — Progress Notes (Signed)
 Patient Care Team: Frederik Charleston, MD as PCP - General (Family Medicine) Vernetta Berg, MD as Consulting Physician (General Surgery) Odean Potts, MD as Consulting Physician (Hematology and Oncology) Dewey Rush, MD as Consulting Physician (Radiation Oncology)  DIAGNOSIS:  Encounter Diagnosis  Name Primary?   Malignant neoplasm of upper-outer quadrant of left breast in female, estrogen receptor positive (HCC) Yes    SUMMARY OF ONCOLOGIC HISTORY: Oncology History  Malignant neoplasm of upper-outer quadrant of left breast in female, estrogen receptor positive (HCC)  01/07/2023 Initial Diagnosis   Screening mammogram detected left breast mass (0.9 cm) with distortion and calcifications spanning 1.8 cm posterior depth, axilla negative; biopsy of the mass: Grade 2 IDC ER 90%, PR 70%, HER2 2+ by IHC, FISH negative, Ki-67 1%   01/13/2023 Cancer Staging   Staging form: Breast, AJCC 8th Edition - Clinical: Stage IA (cT1b, cN0, cM0, G2, ER+, PR+, HER2-) - Signed by Odean Potts, MD on 01/13/2023 Stage prefix: Initial diagnosis Histologic grading system: 3 grade system   01/23/2023 Surgery   Left lumpectomy: Grade 2 IDC 1.4 cm with grade 1-2 DCIS, margins negative, LVI not identified, ER 90%, PR 70%, HER2 negative by FISH, Ki-67 1%    02/19/2023 Oncotype testing   Oncotype score of 27 (16% risk of recurrence)   03/25/2023 - 04/22/2023 Radiation Therapy   Plan Name: Breast_L Site: Breast, Left Technique: 3D Mode: Photon Dose Per Fraction: 2.66 Gy Prescribed Dose (Delivered / Prescribed): 42.56 Gy / 42.56 Gy Prescribed Fxs (Delivered / Prescribed): 16 / 16   Plan Name: Breast_L_Bst Site: Breast, Left Technique: 3D Mode: Photon Dose Per Fraction: 2 Gy Prescribed Dose (Delivered / Prescribed): 8 Gy / 8 Gy Prescribed Fxs (Delivered / Prescribed): 4 / 4   03/2023 -  Anti-estrogen oral therapy   Anastrozole  x 5 years     CHIEF COMPLIANT: Follow-up to discuss antiestrogen  therapy  HISTORY OF PRESENT ILLNESS:   History of Present Illness Arleny MARCELLINA JONSSON is a 78 year old female with stage 1A breast cancer who presents with medication side effects and concerns about recurrence. She is accompanied by her niece.  She has experienced side effects with each medication tried over the past year. Axonastine caused severe itching, leading to discontinuation. Previous medications caused palpitations and hot flashes. A recent heart event required an emergency room visit, and her cardiologist advised holding her current medication due to blood clot risks associated with tamoxifen . She has been on hormone therapies for about a year to manage cancer recurrence risk.  She is aware of potential side effects such as joint stiffness, bone loss, weight gain, hair thinning, and increased cholesterol levels. She is considering using antihistamines like Benadryl or Zyrtec to manage itching if it recurs.  A recent mammogram was clear, and her breast density is categorized as B, indicating less dense tissue.     ALLERGIES:  is allergic to doxycycline, gluten meal, anastrozole , exemestane , letrozole , and lisinopril.  MEDICATIONS:  Current Outpatient Medications  Medication Sig Dispense Refill   acetaminophen  (TYLENOL ) 500 MG tablet Take 1,000-1,500 mg by mouth every 6 (six) hours as needed for moderate pain (pain score 4-6) or mild pain (pain score 1-3).     ALPRAZolam (XANAX) 0.5 MG tablet Take 0.5 mg by mouth 2 (two) times daily as needed for anxiety.     ASPIRIN LOW DOSE 81 MG tablet Take 81 mg by mouth every other day.     atorvastatin (LIPITOR) 20 MG tablet Take 20 mg by mouth  daily.     citalopram (CELEXA) 20 MG tablet Take 20-40 mg by mouth at bedtime as needed (Sleep).     diltiazem (CARDIZEM SR) 120 MG 12 hr capsule Take 120 mg by mouth daily.     diltiazem (CARDIZEM) 30 MG tablet Take 30 mg by mouth as needed. FOR HEART PALPITATIONS     hyoscyamine (ANASPAZ) 0.125 MG TBDP  disintergrating tablet Place 0.125 mg under the tongue every 6 (six) hours as needed for cramping.     ibuprofen (ADVIL) 200 MG tablet Take 200 mg by mouth every 6 (six) hours as needed for moderate pain (pain score 4-6) or mild pain (pain score 1-3).     isosorbide mononitrate (IMDUR) 60 MG 24 hr tablet Take 60 mg by mouth 2 (two) times daily.     ketoconazole (NIZORAL) 2 % cream Apply 1 Application topically daily as needed for irritation (Psoriasis).     letrozole  (FEMARA ) 2.5 MG tablet Take 1 tablet (2.5 mg total) by mouth daily. 90 tablet 3   levothyroxine (SYNTHROID) 112 MCG tablet Take 112 mcg by mouth daily.     losartan (COZAAR) 50 MG tablet Take 50 mg by mouth daily.     magnesium gluconate (MAGONATE) 500 MG tablet Take 500 mg by mouth once a week.     metoprolol  tartrate (LOPRESSOR ) 25 MG tablet Take 0.5 tablets (12.5 mg total) by mouth as needed (sustained palpitations). 30 tablet 0   montelukast (SINGULAIR) 10 MG tablet Take 10 mg by mouth at bedtime.     OVER THE COUNTER MEDICATION Take 1 tablet by mouth once a week. Omega xl     Probiotic Product (PROBIOTIC PO) Take 2 capsules by mouth daily as needed (Constipation). Emma     sodium chloride  (OCEAN) 0.65 % SOLN nasal spray Place 1-2 sprays into both nostrils as needed for congestion.     sulconazole nitrate 1 % external solution Apply 1 application  topically daily as needed (Psoriasis).     No current facility-administered medications for this visit.    PHYSICAL EXAMINATION: ECOG PERFORMANCE STATUS: 1 - Symptomatic but completely ambulatory  Vitals:   01/19/24 1500  BP: (!) 126/54  Pulse: 77  Resp: 16  Temp: 98 F (36.7 C)  SpO2: 98%   Filed Weights   01/19/24 1500  Weight: 246 lb 12.8 oz (111.9 kg)      LABORATORY DATA:  I have reviewed the data as listed    Latest Ref Rng & Units 12/14/2023    3:56 AM 01/13/2023   10:49 AM 08/29/2020    5:16 AM  CMP  Glucose 70 - 99 mg/dL 876  870  827   BUN 8 - 23 mg/dL 9   17  9    Creatinine 0.44 - 1.00 mg/dL 9.30  9.22  9.35   Sodium 135 - 145 mmol/L 134  139  139   Potassium 3.5 - 5.1 mmol/L 3.9  4.0  3.8   Chloride 98 - 111 mmol/L 99  106  104   CO2 22 - 32 mmol/L 21  27  24    Calcium 8.9 - 10.3 mg/dL 9.2  9.6  9.3   Total Protein 6.5 - 8.1 g/dL 7.0  7.8  7.5   Total Bilirubin 0.0 - 1.2 mg/dL 0.6  0.5  0.4   Alkaline Phos 38 - 126 U/L 87  85  80   AST 15 - 41 U/L 19  20  17    ALT 0 - 44 U/L  9  18  12      Lab Results  Component Value Date   WBC 7.7 12/14/2023   HGB 12.7 12/14/2023   HCT 38.1 12/14/2023   MCV 97.7 12/14/2023   PLT 301 12/14/2023   NEUTROABS 3.9 12/14/2023    ASSESSMENT & PLAN:  Malignant neoplasm of upper-outer quadrant of left breast in female, estrogen receptor positive (HCC) 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, margins negative, LVI not identified, ER 90%, PR 70%, HER2 negative by FISH, Ki-67 1%  Oncotype score of 27 (16% risk of recurrence)   Treatment plan: I did not recommend adjuvant chemotherapy because of her age, performance status, mental state as I have a strong suspicion that she would not be able to handle chemotherapy due to mental health concerns and lack of support Adjuvant radiation therapy to be completed 04/02/2023 Followed by adjuvant antiestrogen therapy with letrozole  1 mg daily started 04/16/2023 (could not tolerate anastrozole  because of the same issue of palpitations) switched to exemestane  on 08/11/2023 for palpitations ---------------------------------------------------------------------------------------------------------------- Antiestrogen therapy: Because of her high risk nature, patient is interested in trying antiestrogen therapy once again.  After much discussion we decided to initiate letrozole  therapy.  She will start taking it every other day for a month.  Weight issues: Patient is contemplating on Ozempic   Breast cancer surveillance: Mammogram 12/18/2023: Benign  breast density category B  Telephone visit in 1 month to assess tolerance to letrozole  therapy  Assessment & Plan Estrogen receptor positive breast cancer, left upper-outer quadrant, on adjuvant hormone therapy She experienced significant side effects with multiple hormone therapies. Tamoxifen  was discontinued due to thromboembolism risk. Letrozole  will be restarted despite previous hot flashes, as it reduces cancer recurrence risk from 32% to 16%. - Restart letrozole  every other day for the first month, then increase to daily dosing. - Manage hot flashes and other side effects as needed. - Send new prescription for letrozole  to [facility] in [place].  Surveillance for breast cancer recurrence Surveillance is crucial. Blood-based monitoring platforms like [employer] or Guardant are preferred over frequent scans due to radiation concerns. - Implement blood-based monitoring every six months using Guardant. - Coordinate home blood draws and communicate results to her.  Adverse effects of hormone therapy for breast cancer She experienced adverse effects including itching, palpitations, and hot flashes. Letrozole  is being restarted with awareness of potential side effects. She is informed and prepared to manage these with supportive measures. - Monitor for side effects such as hot flashes, joint stiffness, and bone loss. - Consider antihistamines like Benadryl or Zyrtec for itching if it recurs. - Discuss side effect management strategies with her.      No orders of the defined types were placed in this encounter.  The patient has a good understanding of the overall plan. she agrees with it. she will call with any problems that may develop before the next visit here. Total time spent: 30 mins including face to face time and time spent for planning, charting and co-ordination of care   Naomi MARLA Chad, MD 01/19/24

## 2024-01-19 NOTE — Assessment & Plan Note (Signed)
 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, margins negative, LVI not identified, ER 90%, PR 70%, HER2 negative by FISH, Ki-67 1%  Oncotype score of 27 (16% risk of recurrence)   Treatment plan: I did not recommend adjuvant chemotherapy because of her age, performance status, mental state as I have a strong suspicion that she would not be able to handle chemotherapy due to mental health concerns and lack of support Adjuvant radiation therapy to be completed 04/02/2023 Followed by adjuvant antiestrogen therapy with letrozole  1 mg daily started 04/16/2023 (could not tolerate anastrozole  because of the same issue of palpitations) switched to exemestane  on 08/11/2023 for palpitations ---------------------------------------------------------------------------------------------------------------- Exemestane  toxicities: Palpitations: tolerating this well Occ Hot flashes Weight issues: Patient is contemplating on Ozempic   Breast cancer surveillance: Mammogram 12/18/2023: Benign breast density category B  Return to clinic in 1 year for follow-up

## 2024-01-21 ENCOUNTER — Telehealth: Payer: Self-pay

## 2024-01-21 NOTE — Telephone Encounter (Signed)
 Per md orders entered for Guardant Reveal and all supported documents faxed to [phone]. Faxed confirmation was received.

## 2024-02-19 ENCOUNTER — Inpatient Hospital Stay: Attending: Adult Health | Admitting: Hematology and Oncology

## 2024-02-19 DIAGNOSIS — Z79811 Long term (current) use of aromatase inhibitors: Secondary | ICD-10-CM | POA: Diagnosis not present

## 2024-02-19 DIAGNOSIS — Z923 Personal history of irradiation: Secondary | ICD-10-CM | POA: Diagnosis not present

## 2024-02-19 DIAGNOSIS — Z1732 Human epidermal growth factor receptor 2 negative status: Secondary | ICD-10-CM | POA: Diagnosis not present

## 2024-02-19 DIAGNOSIS — Z1721 Progesterone receptor positive status: Secondary | ICD-10-CM

## 2024-02-19 DIAGNOSIS — C50412 Malignant neoplasm of upper-outer quadrant of left female breast: Secondary | ICD-10-CM

## 2024-02-19 DIAGNOSIS — Z170 Estrogen receptor positive status [ER+]: Secondary | ICD-10-CM | POA: Diagnosis not present

## 2024-02-19 MED ORDER — ANASTROZOLE 1 MG PO TABS: 1.0000 mg | ORAL_TABLET | Freq: Every day | ORAL | 3 refills | Status: DC

## 2024-02-19 MED ORDER — ANASTROZOLE 1 MG PO TABS: 1.0000 mg | ORAL_TABLET | ORAL | 3 refills | Status: AC

## 2024-02-19 NOTE — Assessment & Plan Note (Signed)
 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, margins negative, LVI not identified, ER 90%, PR 70%, HER2 negative by FISH, Ki-67 1%  Oncotype score of 27 (16% risk of recurrence)   Treatment plan: I did not recommend adjuvant chemotherapy because of her age, performance status, mental state as I have a strong suspicion that she would not be able to handle chemotherapy due to mental health concerns and lack of support Adjuvant radiation therapy to be completed 04/02/2023 Followed by adjuvant antiestrogen therapy with letrozole  1 mg daily started 04/16/2023 (could not tolerate anastrozole  because of the same issue of palpitations) switched to exemestane  on 08/11/2023 for palpitations ---------------------------------------------------------------------------------------------------------------- Letrozole  toxicities:    Weight issues: Patient is contemplating on Ozempic   Breast cancer surveillance: Mammogram 12/18/2023: Benign breast density category B Breast exam 02/19/2024: Benign   Follow-up in 1 year

## 2024-02-19 NOTE — Progress Notes (Signed)
 Telephone visit    Patient Care Team: Frederik Charleston, MD as PCP - General (Family Medicine) Vernetta Berg, MD as Consulting Physician (General Surgery) Odean Potts, MD as Consulting Physician (Hematology and Oncology) Dewey Rush, MD as Consulting Physician (Radiation Oncology)  DIAGNOSIS:  Encounter Diagnosis  Name Primary?   Malignant neoplasm of upper-outer quadrant of left breast in female, estrogen receptor positive (HCC) Yes    SUMMARY OF ONCOLOGIC HISTORY: Oncology History  Malignant neoplasm of upper-outer quadrant of left breast in female, estrogen receptor positive (HCC)  01/07/2023 Initial Diagnosis   Screening mammogram detected left breast mass (0.9 cm) with distortion and calcifications spanning 1.8 cm posterior depth, axilla negative; biopsy of the mass: Grade 2 IDC ER 90%, PR 70%, HER2 2+ by IHC, FISH negative, Ki-67 1%   01/13/2023 Cancer Staging   Staging form: Breast, AJCC 8th Edition - Clinical: Stage IA (cT1b, cN0, cM0, G2, ER+, PR+, HER2-) - Signed by Odean Potts, MD on 01/13/2023 Stage prefix: Initial diagnosis Histologic grading system: 3 grade system   01/23/2023 Surgery   Left lumpectomy: Grade 2 IDC 1.4 cm with grade 1-2 DCIS, margins negative, LVI not identified, ER 90%, PR 70%, HER2 negative by FISH, Ki-67 1%    02/19/2023 Oncotype testing   Oncotype score of 27 (16% risk of recurrence)   03/25/2023 - 04/22/2023 Radiation Therapy   Plan Name: Breast_L Site: Breast, Left Technique: 3D Mode: Photon Dose Per Fraction: 2.66 Gy Prescribed Dose (Delivered / Prescribed): 42.56 Gy / 42.56 Gy Prescribed Fxs (Delivered / Prescribed): 16 / 16   Plan Name: Breast_L_Bst Site: Breast, Left Technique: 3D Mode: Photon Dose Per Fraction: 2 Gy Prescribed Dose (Delivered / Prescribed): 8 Gy / 8 Gy Prescribed Fxs (Delivered / Prescribed): 4 / 4   03/2023 -  Anti-estrogen oral therapy   Anastrozole  x 5 years     CHIEF COMPLIANT: Follow-up to discuss  antiestrogen therapy  HISTORY OF PRESENT ILLNESS:   History of Present Illness Ruth Marquez is a 78 year old female who presents for a follow-up regarding her medication management.  She has not started anastrozole  due to the absence of a prescription. She experienced a couple of hot flashes, which she attributes to her age, and reports no itching. A recent blood draw was performed earlier this week, but results are pending. She maintains an active lifestyle with walking and gardening, and follows a gluten-free diet with portion control and increased salad intake.     ALLERGIES:  is allergic to doxycycline, gluten meal, anastrozole , exemestane , letrozole , and lisinopril.  MEDICATIONS:  Current Outpatient Medications  Medication Sig Dispense Refill   acetaminophen  (TYLENOL ) 500 MG tablet Take 1,000-1,500 mg by mouth every 6 (six) hours as needed for moderate pain (pain score 4-6) or mild pain (pain score 1-3).     ALPRAZolam (XANAX) 0.5 MG tablet Take 0.5 mg by mouth 2 (two) times daily as needed for anxiety.     anastrozole  (ARIMIDEX ) 1 MG tablet Take 1 tablet (1 mg total) by mouth every other day. 90 tablet 3   ASPIRIN LOW DOSE 81 MG tablet Take 81 mg by mouth every other day.     atorvastatin (LIPITOR) 20 MG tablet Take 20 mg by mouth daily.     citalopram (CELEXA) 20 MG tablet Take 20-40 mg by mouth at bedtime as needed (Sleep).     diltiazem (CARDIZEM SR) 120 MG 12 hr capsule Take 120 mg by mouth daily.     diltiazem (CARDIZEM) 30 MG  tablet Take 30 mg by mouth as needed. FOR HEART PALPITATIONS     hyoscyamine (ANASPAZ) 0.125 MG TBDP disintergrating tablet Place 0.125 mg under the tongue every 6 (six) hours as needed for cramping.     ibuprofen (ADVIL) 200 MG tablet Take 200 mg by mouth every 6 (six) hours as needed for moderate pain (pain score 4-6) or mild pain (pain score 1-3).     isosorbide mononitrate (IMDUR) 60 MG 24 hr tablet Take 60 mg by mouth 2 (two) times daily.      ketoconazole (NIZORAL) 2 % cream Apply 1 Application topically daily as needed for irritation (Psoriasis).     levothyroxine (SYNTHROID) 112 MCG tablet Take 112 mcg by mouth daily.     losartan (COZAAR) 50 MG tablet Take 50 mg by mouth daily.     magnesium gluconate (MAGONATE) 500 MG tablet Take 500 mg by mouth once a week.     metoprolol  tartrate (LOPRESSOR ) 25 MG tablet Take 0.5 tablets (12.5 mg total) by mouth as needed (sustained palpitations). 30 tablet 0   montelukast (SINGULAIR) 10 MG tablet Take 10 mg by mouth at bedtime.     OVER THE COUNTER MEDICATION Take 1 tablet by mouth once a week. Omega xl     Probiotic Product (PROBIOTIC PO) Take 2 capsules by mouth daily as needed (Constipation). Emma     sodium chloride  (OCEAN) 0.65 % SOLN nasal spray Place 1-2 sprays into both nostrils as needed for congestion.     sulconazole nitrate 1 % external solution Apply 1 application  topically daily as needed (Psoriasis).     No current facility-administered medications for this visit.    PHYSICAL EXAMINATION: ECOG PERFORMANCE STATUS: 1 - Symptomatic but completely ambulatory   LABORATORY DATA:  I have reviewed the data as listed    Latest Ref Rng & Units 12/14/2023    3:56 AM 01/13/2023   10:49 AM 08/29/2020    5:16 AM  CMP  Glucose 70 - 99 mg/dL 876  870  827   BUN 8 - 23 mg/dL 9  17  9    Creatinine 0.44 - 1.00 mg/dL 9.30  9.22  9.35   Sodium 135 - 145 mmol/L 134  139  139   Potassium 3.5 - 5.1 mmol/L 3.9  4.0  3.8   Chloride 98 - 111 mmol/L 99  106  104   CO2 22 - 32 mmol/L 21  27  24    Calcium 8.9 - 10.3 mg/dL 9.2  9.6  9.3   Total Protein 6.5 - 8.1 g/dL 7.0  7.8  7.5   Total Bilirubin 0.0 - 1.2 mg/dL 0.6  0.5  0.4   Alkaline Phos 38 - 126 U/L 87  85  80   AST 15 - 41 U/L 19  20  17    ALT 0 - 44 U/L 9  18  12      Lab Results  Component Value Date   WBC 7.7 12/14/2023   HGB 12.7 12/14/2023   HCT 38.1 12/14/2023   MCV 97.7 12/14/2023   PLT 301 12/14/2023   NEUTROABS 3.9  12/14/2023    ASSESSMENT & PLAN:  Malignant neoplasm of upper-outer quadrant of left breast in female, estrogen receptor positive (HCC) 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, margins negative, LVI not identified, ER 90%, PR 70%, HER2 negative by FISH, Ki-67 1%  Oncotype score of 27 (16% risk of recurrence)   Treatment plan: I did not  recommend adjuvant chemotherapy because of her age, performance status, mental state as I have a strong suspicion that she would not be able to handle chemotherapy due to mental health concerns and lack of support Adjuvant radiation therapy to be completed 04/02/2023 Followed by adjuvant antiestrogen therapy with letrozole  1 mg daily started 04/16/2023 (could not tolerate anastrozole  because of the same issue of palpitations) switched to exemestane  on 08/11/2023 for palpitations, switched to anastrozole  every other day ---------------------------------------------------------------------------------------------------------------- Sent a prescription for anastrozole  Guardant Reveal sent   Weight issues: Patient is contemplating on Ozempic    Breast cancer surveillance: Mammogram 12/18/2023: Benign breast density category B Breast exam 02/19/2024: Benign   Follow-up in 6 months    No orders of the defined types were placed in this encounter.  The patient has a good understanding of the overall plan. she agrees with it. she will call with any problems that may develop before the next visit here. Total time spent: 30 mins including face to face time and time spent for planning, charting and co-ordination of care   Viinay K Keyandre Pileggi, MD 02/19/24

## 2024-02-20 DIAGNOSIS — C50412 Malignant neoplasm of upper-outer quadrant of left female breast: Secondary | ICD-10-CM | POA: Diagnosis not present

## 2024-02-24 ENCOUNTER — Telehealth: Payer: Self-pay | Admitting: *Deleted

## 2024-02-24 NOTE — Telephone Encounter (Signed)
 Per MD request RN placed call to pt with recent Guardant Reveal results being negative.  No answer, LVM for pt to return call to the office.

## 2024-02-26 ENCOUNTER — Encounter: Payer: Self-pay | Admitting: Hematology and Oncology

## 2024-03-23 DIAGNOSIS — R0683 Snoring: Secondary | ICD-10-CM | POA: Diagnosis not present

## 2024-03-23 DIAGNOSIS — I10 Essential (primary) hypertension: Secondary | ICD-10-CM | POA: Diagnosis not present

## 2024-03-23 DIAGNOSIS — R002 Palpitations: Secondary | ICD-10-CM | POA: Diagnosis not present

## 2024-03-23 DIAGNOSIS — I471 Supraventricular tachycardia, unspecified: Secondary | ICD-10-CM | POA: Diagnosis not present

## 2024-04-13 DIAGNOSIS — I10 Essential (primary) hypertension: Secondary | ICD-10-CM | POA: Diagnosis not present

## 2024-04-13 DIAGNOSIS — Z6841 Body Mass Index (BMI) 40.0 and over, adult: Secondary | ICD-10-CM | POA: Diagnosis not present

## 2024-04-13 DIAGNOSIS — M170 Bilateral primary osteoarthritis of knee: Secondary | ICD-10-CM | POA: Diagnosis not present

## 2024-04-13 DIAGNOSIS — Z23 Encounter for immunization: Secondary | ICD-10-CM | POA: Diagnosis not present

## 2024-04-13 DIAGNOSIS — F411 Generalized anxiety disorder: Secondary | ICD-10-CM | POA: Diagnosis not present

## 2024-05-21 ENCOUNTER — Telehealth: Payer: Self-pay

## 2024-05-21 ENCOUNTER — Other Ambulatory Visit: Payer: Self-pay

## 2024-05-21 ENCOUNTER — Emergency Department (HOSPITAL_COMMUNITY)
Admission: EM | Admit: 2024-05-21 | Discharge: 2024-05-21 | Disposition: A | Discharge status: Home / Self Care | Source: Ambulatory Visit | Attending: Emergency Medicine | Admitting: Emergency Medicine

## 2024-05-21 ENCOUNTER — Encounter (HOSPITAL_COMMUNITY): Payer: Self-pay

## 2024-05-21 DIAGNOSIS — Z853 Personal history of malignant neoplasm of breast: Secondary | ICD-10-CM | POA: Diagnosis not present

## 2024-05-21 DIAGNOSIS — R030 Elevated blood-pressure reading, without diagnosis of hypertension: Secondary | ICD-10-CM | POA: Diagnosis present

## 2024-05-21 DIAGNOSIS — I10 Essential (primary) hypertension: Secondary | ICD-10-CM | POA: Diagnosis not present

## 2024-05-21 LAB — CBC
HCT: 36.3 % (ref 36.0–46.0)
Hemoglobin: 11.8 g/dL — ABNORMAL LOW (ref 12.0–15.0)
MCH: 32.8 pg (ref 26.0–34.0)
MCHC: 32.5 g/dL (ref 30.0–36.0)
MCV: 100.8 fL — ABNORMAL HIGH (ref 80.0–100.0)
Platelets: 280 K/uL (ref 150–400)
RBC: 3.6 MIL/uL — ABNORMAL LOW (ref 3.87–5.11)
RDW: 13.3 % (ref 11.5–15.5)
WBC: 7.1 K/uL (ref 4.0–10.5)
nRBC: 0 % (ref 0.0–0.2)

## 2024-05-21 LAB — BASIC METABOLIC PANEL WITH GFR
Anion gap: 13 (ref 5–15)
BUN: 9 mg/dL (ref 8–23)
CO2: 21 mmol/L — ABNORMAL LOW (ref 22–32)
Calcium: 9 mg/dL (ref 8.9–10.3)
Chloride: 106 mmol/L (ref 98–111)
Creatinine, Ser: 0.57 mg/dL (ref 0.44–1.00)
GFR, Estimated: >60 mL/min
Glucose, Bld: 129 mg/dL — ABNORMAL HIGH (ref 70–99)
Potassium: 3.5 mmol/L (ref 3.5–5.1)
Sodium: 140 mmol/L (ref 135–145)

## 2024-05-21 MED ORDER — LOSARTAN POTASSIUM 25 MG PO TABS
50.0000 mg | ORAL_TABLET | Freq: Once | ORAL | Status: AC
  Administered 2024-05-21: 50 mg via ORAL
  Filled 2024-05-21: qty 2

## 2024-05-21 MED ORDER — DILTIAZEM HCL ER 60 MG PO CP12
120.0000 mg | ORAL_CAPSULE | Freq: Once | ORAL | Status: AC
  Administered 2024-05-21: 120 mg via ORAL
  Filled 2024-05-21: qty 2

## 2024-05-21 NOTE — Discharge Instructions (Addendum)
 It was our pleasure to provide your ER care today - we hope that you feel better.  Your blood pressure is high today, but is currently improved from your initial reading. Your lab work appears similar to your baseline.   Continue your home meds. Limit salt intake, follow heart healthy eating plan.   Follow up with your doctor for recheck in the coming week.   Return to ER if worse, new symptoms, fevers, new/severe pain, chest pain, trouble breathing, numbness/weakness, change in speech or vision, or other concern.

## 2024-05-21 NOTE — Telephone Encounter (Signed)
 Patient called w/ concern about blood pressure being too elevated to read with cuff. RN advised pt go to ER to be seen due to risk of stroke.Pt suspects hypertension to be related to anastrazole. RN advised pt could also hold anastrazole for a couple of weeks. Pt verbalized understanding.

## 2024-05-21 NOTE — ED Provider Notes (Signed)
 " [place] [facility] DEPARTMENT AT [facility] Provider Note   CSN: [id number] Arrival date & time: 05/21/24  9061     Patient presents with: Hypertension   Ruth Marquez is a 78 y.o. female.   Pt with hx htn, breast ca, c/o being concerned about high blood pressure. Denies recent change in meds or new meds, but states read that anastrozole  can increase blood pressure. Has adequate meds at home, took her bp meds last night, but has not yet taken her morning meds. Denies recent change in meds or doses. No headache. No change in speech or vision. No new numbness or weakness or loss of normal functional ability. No chest pain or sob. No abd pain or nvd. No new or worsening swelling.   The history is provided by the patient and medical records.  Hypertension Pertinent negatives include no chest pain, no abdominal pain, no headaches and no shortness of breath.       Prior to Admission medications  Medication Sig Start Date End Date Taking? Authorizing Provider  acetaminophen  (TYLENOL ) 500 MG tablet Take 1,000-1,500 mg by mouth every 6 (six) hours as needed for moderate pain (pain score 4-6) or mild pain (pain score 1-3).    [provider]  ALPRAZolam (XANAX) 0.5 MG tablet Take 0.5 mg by mouth 2 (two) times daily as needed for anxiety.    [provider]  anastrozole  (ARIMIDEX ) 1 MG tablet Take 1 tablet (1 mg total) by mouth every other day. 02/19/24   Gudena, Vinay, MD  ASPIRIN LOW DOSE 81 MG tablet Take 81 mg by mouth every other day.    [provider]  atorvastatin (LIPITOR) 20 MG tablet Take 20 mg by mouth daily.    [provider]  citalopram (CELEXA) 20 MG tablet Take 20-40 mg by mouth at bedtime as needed (Sleep).    [provider]  diltiazem  (CARDIZEM  SR) 120 MG 12 hr capsule Take 120 mg by mouth daily.    [provider]  diltiazem  (CARDIZEM ) 30 MG tablet Take 30 mg by mouth as needed. FOR HEART PALPITATIONS  12/22/23   [provider]  hyoscyamine (ANASPAZ) 0.125 MG TBDP disintergrating tablet Place 0.125 mg under the tongue every 6 (six) hours as needed for cramping.    [provider]  ibuprofen (ADVIL) 200 MG tablet Take 200 mg by mouth every 6 (six) hours as needed for moderate pain (pain score 4-6) or mild pain (pain score 1-3).    [provider]  isosorbide mononitrate (IMDUR) 60 MG 24 hr tablet Take 60 mg by mouth 2 (two) times daily.    [provider]  ketoconazole (NIZORAL) 2 % cream Apply 1 Application topically daily as needed for irritation (Psoriasis).    [provider]  levothyroxine (SYNTHROID) 112 MCG tablet Take 112 mcg by mouth daily. 01/26/14   [provider]  losartan  (COZAAR ) 50 MG tablet Take 50 mg by mouth daily. 07/09/23   [provider]  magnesium gluconate (MAGONATE) 500 MG tablet Take 500 mg by mouth once a week.    [provider]  metoprolol  tartrate (LOPRESSOR ) 25 MG tablet Take 0.5 tablets (12.5 mg total) by mouth as needed (sustained palpitations). 12/14/23   Melvenia Motto, MD  montelukast (SINGULAIR) 10 MG tablet Take 10 mg by mouth at bedtime. 09/09/22   [provider]  OVER THE COUNTER MEDICATION Take 1 tablet by mouth once a week. Omega xl    [provider]  Probiotic Product (PROBIOTIC PO) Take 2 capsules by mouth daily as needed (Constipation). Emma    [provider]  sodium chloride  (OCEAN) 0.65 % SOLN nasal spray Place 1-2 sprays into both nostrils as needed for congestion.    [provider]  sulconazole nitrate 1 % external solution Apply 1 application  topically daily as needed (Psoriasis).    [provider]    Allergies: Doxycycline, Gluten meal, Anastrozole , Exemestane , Letrozole , and Lisinopril    Review of Systems  Constitutional:  Negative for chills and fever.  Eyes:  Negative for visual disturbance.  Respiratory:  Negative for  shortness of breath.   Cardiovascular:  Negative for chest pain.  Gastrointestinal:  Negative for abdominal pain, nausea and vomiting.  Genitourinary:  Negative for flank pain.  Neurological:  Negative for speech difficulty, weakness, numbness and headaches.    Updated Vital Signs BP (!) 145/71   Pulse 72   Temp 98.4 F (36.9 C) (Oral)   Resp 16   Ht 1.651 m (5' 5)   Wt 111.9 kg   SpO2 96%   BMI 41.05 kg/m   Physical Exam Vitals and nursing note reviewed.  Constitutional:      Appearance: Normal appearance. She is well-developed.  HENT:     Head: Atraumatic.     Nose: Nose normal.     Mouth/Throat:     Mouth: Mucous membranes are moist.  Eyes:     General: No scleral icterus.    Conjunctiva/sclera: Conjunctivae normal.     Pupils: Pupils are equal, round, and reactive to light.  Neck:     Trachea: No tracheal deviation.     Comments: Trachea midline. Thyroid  not grossly enlarged or tender.  Cardiovascular:     Rate and Rhythm: Normal rate and regular rhythm.     Pulses: Normal pulses.     Heart sounds: Normal heart sounds. No murmur heard.    No friction rub. No gallop.  Pulmonary:     Effort: Pulmonary effort is normal. No respiratory distress.     Breath sounds: Normal breath sounds.  Abdominal:     General: There is no distension.     Palpations: Abdomen is soft.     Tenderness: There is no abdominal tenderness.  Musculoskeletal:        General: No tenderness.     Cervical back: Normal range of motion and neck supple. No rigidity. No muscular tenderness.     Comments: Mild, symmetric bil ankle edema.   Skin:    General: Skin is warm and dry.     Findings: No rash.  Neurological:     Mental Status: She is alert.     Comments: Alert, speech normal.  Motor/sens grossly intact bil.   Psychiatric:        Mood and Affect: Mood normal.     (all labs ordered are listed, but only abnormal results are displayed) Results for orders placed or performed during  the hospital encounter of 05/21/24  Basic metabolic panel   Collection Time: 05/21/24 10:41 AM  Result Value Ref Range   Sodium 140 135 - 145 mmol/L   Potassium 3.5 3.5 - 5.1 mmol/L   Chloride 106 98 - 111 mmol/L   CO2 21 (L) 22 - 32 mmol/L   Glucose, Bld 129 (H) 70 - 99 mg/dL   BUN 9 8 - 23 mg/dL   Creatinine, Ser 9.42 0.44 - 1.00 mg/dL   Calcium 9.0 8.9 - 89.6 mg/dL  GFR, Estimated >60 >60 mL/min   Anion gap 13 5 - 15  CBC   Collection Time: 05/21/24 10:41 AM  Result Value Ref Range   WBC 7.1 4.0 - 10.5 K/uL   RBC 3.60 (L) 3.87 - 5.11 MIL/uL   Hemoglobin 11.8 (L) 12.0 - 15.0 g/dL   HCT 63.6 63.9 - 53.9 %   MCV 100.8 (H) 80.0 - 100.0 fL   MCH 32.8 26.0 - 34.0 pg   MCHC 32.5 30.0 - 36.0 g/dL   RDW 86.6 88.4 - 84.4 %   Platelets 280 150 - 400 K/uL   nRBC 0.0 0.0 - 0.2 %     EKG: None  Radiology: No results found.   Procedures   Medications Ordered in the ED  losartan  (COZAAR ) tablet 50 mg (50 mg Oral Given 05/21/24 1153)  diltiazem  (CARDIZEM  SR) 12 hr capsule 120 mg (120 mg Oral Given 05/21/24 1154)                                    Medical Decision Making Problems Addressed: Elevated blood pressure reading: acute illness or injury Essential hypertension: chronic illness or injury with exacerbation, progression, or side effects of treatment that poses a threat to life or bodily functions Uncontrolled hypertension: acute illness or injury  Amount and/or Complexity of Data Reviewed External Data Reviewed: notes. Labs: ordered. Decision-making details documented in ED Course.  Risk Prescription drug management. Decision regarding hospitalization.   Continuous pulse ox and cardiac monitoring. Labs ordered/sent.  Differential diagnosis includes uncontrolled htn, aki, etc. Dispo decision including potential need for admission considered - will get labs and reassess.   Reviewed nursing notes and prior charts for additional history. External reports  reviewed.   Labs reviewed/interpreted by me - wbc and hct normal. Chem unremarkable.   Pt given her am bp meds.   Recheck pt improved, 145/71.   Pt currently appears stable for ed d/c.   Rec close pcp f/u.  Return precautions provided.       Final diagnoses:  None    ED Discharge Orders     None          Bernard Drivers, MD 05/21/24 1246  "

## 2024-05-21 NOTE — ED Triage Notes (Signed)
 Pt reports high blood pressure readings. Pt states she is receiving cancer treatment and feels like her medication is not working right now. Pt reports lightheaded and anxious.

## 2024-05-21 NOTE — ED Notes (Signed)
 Ginger ale and sandwhich.
